# Patient Record
Sex: Male | Born: 1965 | Race: Black or African American | Hispanic: No | Marital: Married | Smoking: Former smoker
Health system: Southern US, Community
[De-identification: ages and names within clinical notes are randomized; demographics above are authoritative.]

## PROBLEM LIST (undated history)

## (undated) DIAGNOSIS — J45909 Unspecified asthma, uncomplicated: Secondary | ICD-10-CM

## (undated) HISTORY — PX: LYMPH NODE BIOPSY: SHX201

## (undated) HISTORY — PX: APPENDECTOMY: SHX54

---

## 2012-03-29 ENCOUNTER — Emergency Department (HOSPITAL_COMMUNITY)
Admission: EM | Admit: 2012-03-29 | Discharge: 2012-03-29 | Disposition: A | Discharge status: Home / Self Care | Payer: Self-pay | Attending: Emergency Medicine | Admitting: Emergency Medicine

## 2012-03-29 ENCOUNTER — Encounter (HOSPITAL_COMMUNITY): Payer: Self-pay

## 2012-03-29 DIAGNOSIS — H938X9 Other specified disorders of ear, unspecified ear: Secondary | ICD-10-CM

## 2012-03-29 DIAGNOSIS — IMO0002 Reserved for concepts with insufficient information to code with codable children: Secondary | ICD-10-CM | POA: Insufficient documentation

## 2012-03-29 DIAGNOSIS — R21 Rash and other nonspecific skin eruption: Secondary | ICD-10-CM | POA: Insufficient documentation

## 2012-03-29 DIAGNOSIS — T169XXA Foreign body in ear, unspecified ear, initial encounter: Secondary | ICD-10-CM | POA: Insufficient documentation

## 2012-03-29 DIAGNOSIS — J45909 Unspecified asthma, uncomplicated: Secondary | ICD-10-CM | POA: Insufficient documentation

## 2012-03-29 DIAGNOSIS — Z87891 Personal history of nicotine dependence: Secondary | ICD-10-CM | POA: Insufficient documentation

## 2012-03-29 HISTORY — DX: Unspecified asthma, uncomplicated: J45.909

## 2012-03-29 NOTE — ED Provider Notes (Signed)
History     CSN: [id number]  Arrival date & time 03/29/12  4098   First MD Initiated Contact with Patient 03/29/12 0800      Chief Complaint  Patient presents with  . Hearing Problem    (Consider location/radiation/quality/duration/timing/severity/associated sxs/prior treatment) HPI Pt states he has been trying to clean out his L ear with peroxide and Q-tips. Decreased hearing since yesterday out of L ear. No pain, fever or chills.  Has been working in the sun and complains of generalized rash. No itching, pain  Past Medical History  Diagnosis Date  . Asthma     Past Surgical History  Procedure Date  . Appendectomy     No family history on file.  History  Substance Use Topics  . Smoking status: Former Games developer  . Smokeless tobacco: Not on file  . Alcohol Use: No      Review of Systems  Constitutional: Negative for fever and chills.  HENT: Positive for hearing loss. Negative for ear pain, congestion, sore throat, rhinorrhea, neck pain, neck stiffness, sinus pressure and ear discharge.   Musculoskeletal: Negative for myalgias and arthralgias.  Skin: Positive for rash. Negative for wound.  Neurological: Negative for dizziness, weakness, numbness and headaches.    Allergies  Shellfish allergy  Home Medications   Current Outpatient Rx  Name Route Sig Dispense Refill  . ALBUTEROL SULFATE HFA 108 (90 BASE) MCG/ACT IN AERS Inhalation Inhale 2 puffs into the lungs every 4 (four) hours as needed. For asthma      BP 126/80  Pulse 63  Temp 97.7 F (36.5 C) (Oral)  Resp 20  SpO2 99%  Physical Exam  Nursing note and vitals reviewed. Constitutional: He is oriented to person, place, and time. He appears well-developed and well-nourished. No distress.  HENT:  Head: Normocephalic and atraumatic.  Mouth/Throat: Oropharynx is clear and moist.       L ear canal with debris adjacent to TM. No evidence of inflammation or TM perforation  Eyes: EOM are normal. Pupils are  equal, round, and reactive to light.  Neck: Normal range of motion. Neck supple.  Cardiovascular: Normal rate and regular rhythm.   Pulmonary/Chest: Effort normal and breath sounds normal. No respiratory distress. He has no wheezes. He has no rales.  Abdominal: Soft. Bowel sounds are normal. There is no tenderness. There is no rebound and no guarding.  Musculoskeletal: Normal range of motion. He exhibits no edema and no tenderness.  Neurological: He is alert and oriented to person, place, and time.       No hearing deficits noted  Skin: Skin is warm and dry. Rash (mild scatter papular rash on face and shoulders. No evidence of infection) noted. No erythema.  Psychiatric: He has a normal mood and affect. His behavior is normal.    ED Course  Procedures (including critical care time)  Labs Reviewed - No data to display No results found.   1. Clogged ear    L canal irrigated and debris flushed. Pt states he hears better. Advised to avoid excessive sun-exposure. Return for concerns   MDM          Loren Racer, MD 03/29/12 [phone]

## 2012-03-29 NOTE — ED Notes (Signed)
Lt. Hearing loss since yesterday, also would like his rash to be checked. Rash is on his shoulders, face.

## 2015-12-07 ENCOUNTER — Emergency Department (HOSPITAL_COMMUNITY): Payer: Self-pay

## 2015-12-07 ENCOUNTER — Telehealth: Payer: Self-pay | Admitting: *Deleted

## 2015-12-07 ENCOUNTER — Emergency Department (HOSPITAL_COMMUNITY)
Admission: EM | Admit: 2015-12-07 | Discharge: 2015-12-07 | Disposition: A | Discharge status: Home / Self Care | Payer: Self-pay | Attending: Emergency Medicine | Admitting: Emergency Medicine

## 2015-12-07 ENCOUNTER — Encounter (HOSPITAL_COMMUNITY): Payer: Self-pay | Admitting: *Deleted

## 2015-12-07 DIAGNOSIS — S62319A Displaced fracture of base of unspecified metacarpal bone, initial encounter for closed fracture: Secondary | ICD-10-CM

## 2015-12-07 DIAGNOSIS — S62316A Displaced fracture of base of fifth metacarpal bone, right hand, initial encounter for closed fracture: Secondary | ICD-10-CM | POA: Insufficient documentation

## 2015-12-07 DIAGNOSIS — S62314A Displaced fracture of base of fourth metacarpal bone, right hand, initial encounter for closed fracture: Secondary | ICD-10-CM | POA: Insufficient documentation

## 2015-12-07 DIAGNOSIS — J45909 Unspecified asthma, uncomplicated: Secondary | ICD-10-CM | POA: Insufficient documentation

## 2015-12-07 DIAGNOSIS — Y9389 Activity, other specified: Secondary | ICD-10-CM | POA: Insufficient documentation

## 2015-12-07 DIAGNOSIS — S63051A Subluxation of other carpometacarpal joint of right hand, initial encounter: Secondary | ICD-10-CM | POA: Insufficient documentation

## 2015-12-07 DIAGNOSIS — Z87891 Personal history of nicotine dependence: Secondary | ICD-10-CM | POA: Insufficient documentation

## 2015-12-07 DIAGNOSIS — Y998 Other external cause status: Secondary | ICD-10-CM | POA: Insufficient documentation

## 2015-12-07 DIAGNOSIS — Z79899 Other long term (current) drug therapy: Secondary | ICD-10-CM | POA: Insufficient documentation

## 2015-12-07 DIAGNOSIS — Y9289 Other specified places as the place of occurrence of the external cause: Secondary | ICD-10-CM | POA: Insufficient documentation

## 2015-12-07 MED ORDER — OXYCODONE-ACETAMINOPHEN 5-325 MG PO TABS: 1.0000 | ORAL_TABLET | Freq: Four times a day (QID) | ORAL | Status: DC | PRN

## 2015-12-07 MED ORDER — OXYCODONE-ACETAMINOPHEN 5-325 MG PO TABS
2.0000 | ORAL_TABLET | Freq: Once | ORAL | Status: AC
  Administered 2015-12-07: 2 via ORAL
  Filled 2015-12-07: qty 2

## 2015-12-07 NOTE — Telephone Encounter (Signed)
Spoke with pt who says he has been unable to sleep since his EDV this am and he is concerned that that the cast that was placed in the ED  is "too tight." Describes tingling in his fingers and thumb of his affected hand. CM instructed pt to elevate his hand above his head which he stated he has been doing. Encouraged Ice. He states he has been unable to sleep and hand continues to throb to the point where he is concerned. CM encouraged that he might visit the local Urgent Care and have the Ulnar Gutter Splint that was placed in the ED checked. CM explained that swelling is expected with a fracture of the Meta-carpel base of the right hand such as he sustained but if he is unable to feel any space between his skin and the splint due to swelling he might need to have it assessed by a medical professional.  Pt appreciative of assistance and reports he is going to have it checked today at his local Urgent Care. No further CM needs at this time.

## 2015-12-07 NOTE — ED Notes (Signed)
Pt is able to move fingers on the right hand. Sensation intact.
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
## 2015-12-07 NOTE — ED Notes (Signed)
Ortho aware of need for splint 

## 2015-12-07 NOTE — Discharge Instructions (Signed)
Keep your hand elevated as much as possible. Apply ice to limit swelling 3-4 times per day for 15-20 minutes each time. Wear a cast at all times. Follow up with Dr. Caralyn Guile as soon as you are able. Take Percocet as needed for pain control. Return to the ED if you have worsening numbness in your fingers and/or if your fingers turn pale/white or become cold.  Metacarpal Fracture A metacarpal fracture is a break (fracture) of a bone in the hand. Metacarpals are the bones that extend from your knuckles to your wrist. In each hand, you have five metacarpal bones that connect your fingers and your thumb to your wrist. Some hand fractures have bone pieces that are close together and stable (simple). These fractures may be treated with only a splint or cast. Hand fractures that have many pieces of broken bone (comminuted), unstable bone pieces (displaced), or a bone that breaks through the skin (compound) usually require surgery. CAUSES This injury may be caused by:  A fall.  A hard, direct hit to your hand.  An injury that squeezes your knuckle, stretches your finger out of place, or crushes your hand. RISK FACTORS This injury is more likely to occur if:  You play contact sports.  You have certain bone diseases. SYMPTOMS  Symptoms of this type of fracture develop soon after the injury. Symptoms may include:  Swelling.  Pain.  Stiffness.  Increased pain with movement.  Bruising.  Inability to move a finger.  A shortened finger.  A finger knuckle that looks sunken in.  Unusual appearance of the hand or finger (deformity). DIAGNOSIS  This injury may be diagnosed based on your signs and symptoms, especially if you had a recent hand injury. Your health care provider will perform a physical exam. He or she may also order X-rays to confirm the diagnosis.  TREATMENT  Treatment for this injury depends on the type of fracture you have and how severe it is. Possible treatments  include:  Non-reduction. This can be done if the bone does not need to be moved back into place. The fracture can be casted or splinted as it is.   Closed reduction. If your bone is stable and can be moved back into place, you may only need to wear a cast or splint or have buddy taping.  Closed reduction with internal fixation (CRIF). This is the most common treatment. You may have this procedure if your bone can be moved back into place but needs more support. Wires, pins, or screws may be inserted through your skin to stabilize the fracture.  Open reduction with internal fixation (ORIF). This may be needed if your fracture is severe and unstable. It involves surgery to move your bone back into the right position. Screws, wires, or plates are used to stabilize the fracture. After all procedures, you may need to wear a cast or a splint for several weeks. You will also need to have follow-up X-rays to make sure that the bone is healing well and staying in position. After you no longer need your cast or splint, you may need physical therapy. This will help you to regain full movement and strength in your hand.  HOME CARE INSTRUCTIONS  If You Have a Cast:  Do not stick anything inside the cast to scratch your skin. Doing that increases your risk of infection.  Check the skin around the cast every day. Report any concerns to your health care provider. You may put lotion on dry skin  around the edges of the cast. Do not apply lotion to the skin underneath the cast. If You Have a Splint:  Wear it as directed by your health care provider. Remove it only as directed by your health care provider.  Loosen the splint if your fingers become numb and tingle, or if they turn cold and blue. Bathing  Cover the cast or splint with a watertight plastic bag to protect it from water while you take a bath or a shower. Do not let the cast or splint get wet. Managing Pain, Stiffness, and Swelling  If directed,  apply ice to the injured area (if you have a splint, not a cast):  Put ice in a plastic bag.  Place a towel between your skin and the bag.  Leave the ice on for 20 minutes, 2-3 times a day.  Move your fingers often to avoid stiffness and to lessen swelling.  Raise the injured area above the level of your heart while you are sitting or lying down. Driving  Do not drive or operate heavy machinery while taking pain medicine.  Do not drive while wearing a cast or splint on a hand that you use for driving. Activity  Return to your normal activities as directed by your health care provider. Ask your health care provider what activities are safe for you. General Instructions  Do not put pressure on any part of the cast or splint until it is fully hardened. This may take several hours.  Keep the cast or splint clean and dry.  Do not use any tobacco products, including cigarettes, chewing tobacco, or electronic cigarettes. Tobacco can delay bone healing. If you need help quitting, ask your health care provider.  Take medicines only as directed by your health care provider.  Keep all follow-up visits as directed by your health care provider. This is important. SEEK MEDICAL CARE IF:   Your pain is getting worse.  You have redness, swelling, or pain in the injured area.   You have fluid, blood, or pus coming from under your cast or splint.   You notice a bad smell coming from under your cast or splint.   You have a fever.  SEEK IMMEDIATE MEDICAL CARE IF:   You develop a rash.   You have trouble breathing.   Your skin or nails on your injured hand turn blue or gray even after you loosen your splint.  Your injured hand feels cold or becomes numb even after you loosen your splint.   You develop severe pain under the cast or in your hand.   This information is not intended to replace advice given to you by your health care provider. Make sure you discuss any questions you  have with your health care provider.   Document Released: 09/06/2005 Document Revised: 05/28/2015 Document Reviewed: 06/26/2014 Elsevier Interactive Patient Education [employer].

## 2015-12-07 NOTE — ED Notes (Signed)
Pt given a small ice pack for rt hand
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
## 2016-09-08 ENCOUNTER — Encounter (HOSPITAL_COMMUNITY): Payer: Self-pay | Admitting: Vascular Surgery

## 2016-09-08 DIAGNOSIS — J45909 Unspecified asthma, uncomplicated: Secondary | ICD-10-CM | POA: Insufficient documentation

## 2016-09-08 DIAGNOSIS — Z202 Contact with and (suspected) exposure to infections with a predominantly sexual mode of transmission: Secondary | ICD-10-CM | POA: Insufficient documentation

## 2016-09-08 DIAGNOSIS — F17210 Nicotine dependence, cigarettes, uncomplicated: Secondary | ICD-10-CM | POA: Insufficient documentation

## 2016-09-08 NOTE — ED Triage Notes (Signed)
Pt reports to the ED for eval of possible STD exposure. Pt was told by his partner that she was treated for an STD and given Flagyl. Pt has had unprotected sexual intercourse with the partner. He denies any symptoms at this time.

## 2016-09-09 ENCOUNTER — Emergency Department (HOSPITAL_COMMUNITY)
Admission: EM | Admit: 2016-09-09 | Discharge: 2016-09-09 | Disposition: A | Discharge status: Home / Self Care | Payer: Self-pay | Attending: Emergency Medicine | Admitting: Emergency Medicine

## 2016-09-09 DIAGNOSIS — Z711 Person with feared health complaint in whom no diagnosis is made: Secondary | ICD-10-CM

## 2016-09-09 LAB — GC/CHLAMYDIA PROBE AMP (~~LOC~~) NOT AT ARMC
CHLAMYDIA, DNA PROBE: NEGATIVE
NEISSERIA GONORRHEA: NEGATIVE

## 2016-09-09 LAB — HIV ANTIBODY (ROUTINE TESTING W REFLEX): HIV Screen 4th Generation wRfx: NONREACTIVE

## 2016-09-09 LAB — RPR: RPR Ser Ql: NONREACTIVE

## 2016-09-09 MED ORDER — CEFTRIAXONE SODIUM 250 MG IJ SOLR
250.0000 mg | Freq: Once | INTRAMUSCULAR | Status: AC
  Administered 2016-09-09: 250 mg via INTRAMUSCULAR
  Filled 2016-09-09: qty 250

## 2016-09-09 MED ORDER — METRONIDAZOLE 500 MG PO TABS: 500.0000 mg | ORAL_TABLET | Freq: Two times a day (BID) | ORAL | 0 refills | Status: DC

## 2016-09-09 MED ORDER — AZITHROMYCIN 250 MG PO TABS
1000.0000 mg | ORAL_TABLET | Freq: Once | ORAL | Status: AC
  Administered 2016-09-09: 1000 mg via ORAL
  Filled 2016-09-09: qty 4

## 2016-09-09 NOTE — ED Notes (Signed)
See EDP assessment 

## 2016-09-09 NOTE — ED Provider Notes (Signed)
[facility] DEPT Provider Note   CSN: [id number] Arrival date & time: 09/08/16  2332     History   Chief Complaint Chief Complaint  Patient presents with  . Exposure to STD    HPI Mark Abbott is a 50 y.o. male.  Mark Abbott is a 50 y.o. Male who presents to the emergency department with concern for STD. Patient reports his most recent sexual partner was prescribed Flagyl. He is unsure what she was diagnosed with. Suspect Trichomonas. He reports he was sexually active with her without production. He denies any symptoms of an STD currently. He denies any penile discharge. No rashes. He does want to be tested for gonorrhea, chlamydia, HIV and syphilis. He tells me he would like treatment for gonorrhea and chlamydia here today as well. Patient denies fevers, abdominal pain, nausea, vomiting, penile discharge, testicular pain, penile pain or rashes.   The history is provided by the patient. No language interpreter was used.  Exposure to STD  Pertinent negatives include no abdominal pain.    Past Medical History:  Diagnosis Date  . Asthma     There are no active problems to display for this patient.   Past Surgical History:  Procedure Laterality Date  . APPENDECTOMY    . LYMPH NODE BIOPSY         Home Medications    Prior to Admission medications   Medication Sig Start Date End Date Taking? Authorizing Provider  albuterol (PROVENTIL HFA;VENTOLIN HFA) 108 (90 BASE) MCG/ACT inhaler Inhale 2 puffs into the lungs every 4 (four) hours as needed. For asthma    Historical Provider, MD  metroNIDAZOLE (FLAGYL) 500 MG tablet Take 1 tablet (500 mg total) by mouth 2 (two) times daily. 09/09/16   Waynetta Pean, PA-C  oxyCODONE-acetaminophen (PERCOCET/ROXICET) 5-325 MG tablet Take 1-2 tablets by mouth every 6 (six) hours as needed for moderate pain or severe pain. 12/07/15   Antonietta Breach, PA-C    Family History No family history on file.  Social History Social History    Substance Use Topics  . Smoking status: Current Every Day Smoker    Packs/day: 0.25    Types: Cigarettes  . Smokeless tobacco: Never Used  . Alcohol use No     Allergies   Shellfish allergy   Review of Systems Review of Systems  Constitutional: Negative for fever.  HENT: Negative for mouth sores.   Gastrointestinal: Negative for abdominal pain, nausea and vomiting.  Genitourinary: Negative for difficulty urinating, discharge, frequency, penile pain, penile swelling, testicular pain and urgency.  Skin: Negative for rash and wound.     Physical Exam Updated Vital Signs BP 135/76 (BP Location: Right Arm)   Pulse 92   Temp 98.4 F (36.9 C) (Oral)   Resp 16   SpO2 97%   Physical Exam  Constitutional: He appears well-developed and well-nourished. No distress.  HENT:  Head: Normocephalic and atraumatic.  Eyes: Right eye exhibits no discharge. Left eye exhibits no discharge.  Pulmonary/Chest: Effort normal. No respiratory distress.  Abdominal: Soft. There is no tenderness.  Genitourinary: Penis normal. No penile tenderness.  Genitourinary Comments: Male RN present chaperone. No penile discharge or tenderness to palpation. Patient is circumcised. No GU rashes noted. No testicular tenderness to palpation.  Neurological: He is alert. Coordination normal.  Skin: Skin is warm and dry. Capillary refill takes less than 2 seconds. No rash noted. He is not diaphoretic. No erythema. No pallor.  Psychiatric: He has a normal mood and affect. His behavior  is normal.  Nursing note and vitals reviewed.    ED Treatments / Results  Labs (all labs ordered are listed, but only abnormal results are displayed) Labs Reviewed  RPR  HIV ANTIBODY (ROUTINE TESTING)  GC/CHLAMYDIA PROBE AMP ([place]) NOT AT [facility]    EKG  EKG Interpretation None       Radiology No results found.  Procedures Procedures (including critical care time)  Medications Ordered in ED Medications   cefTRIAXone (ROCEPHIN) injection 250 mg (not administered)  azithromycin (ZITHROMAX) tablet 1,000 mg (not administered)     Initial Impression / Assessment and Plan / ED Course  I have reviewed the triage vital signs and the nursing notes.  Pertinent labs & imaging results that were available during my care of the patient were reviewed by me and considered in my medical decision making (see chart for details).  Clinical Course    This is a 50 y.o. Male who presents to the emergency department with concern for STD. Patient reports his most recent sexual partner was prescribed Flagyl. He is unsure what she was diagnosed with. Suspect Trichomonas. He reports he was sexually active with her without production. He denies any symptoms of an STD currently. He denies any penile discharge. No rashes. He does want to be tested for gonorrhea, chlamydia, HIV and syphilis. He tells me he would like treatment for gonorrhea and chlamydia here today as well. On exam the patient is afebrile nontoxic appearing. No GU rashes noted. No penile discharge noted. Abdomen is soft and nontender to palpation. Will go ahead and treat for gonorrhea and chlamydia with Rocephin and azithromycin. Testing pending for HIV, syphilis, gonorrhea and chlamydia. I discussed safe sex practices. I advised that his testing is pending. I encouraged him to follow-up on these test results. I encouraged him to advise all sexual partners. We'll also treat with Flagyl for possible Trichomonas. I advised the patient to follow-up with their primary care provider this week. I advised the patient to return to the emergency department with new or worsening symptoms or new concerns. The patient verbalized understanding and agreement with plan.    Final Clinical Impressions(s) / ED Diagnoses   Final diagnoses:  Concern about STD in male without diagnosis    New Prescriptions New Prescriptions   METRONIDAZOLE (FLAGYL) 500 MG TABLET    Take 1  tablet (500 mg total) by mouth 2 (two) times daily.     Waynetta Pean, PA-C 09/09/16 [id number]    Everlene Balls, MD 09/09/16 [phone]

## 2018-08-19 ENCOUNTER — Emergency Department (HOSPITAL_COMMUNITY)
Admission: EM | Admit: 2018-08-19 | Discharge: 2018-08-19 | Disposition: A | Discharge status: Home / Self Care | Payer: No Typology Code available for payment source | Attending: Emergency Medicine | Admitting: Emergency Medicine

## 2018-08-19 ENCOUNTER — Encounter (HOSPITAL_COMMUNITY): Payer: Self-pay | Admitting: Emergency Medicine

## 2018-08-19 DIAGNOSIS — M25512 Pain in left shoulder: Secondary | ICD-10-CM | POA: Diagnosis present

## 2018-08-19 DIAGNOSIS — F17210 Nicotine dependence, cigarettes, uncomplicated: Secondary | ICD-10-CM | POA: Insufficient documentation

## 2018-08-19 DIAGNOSIS — Z79899 Other long term (current) drug therapy: Secondary | ICD-10-CM | POA: Diagnosis not present

## 2018-08-19 DIAGNOSIS — J45909 Unspecified asthma, uncomplicated: Secondary | ICD-10-CM | POA: Insufficient documentation

## 2018-08-19 MED ORDER — LIDOCAINE 5 % EX PTCH: 1.0000 | MEDICATED_PATCH | CUTANEOUS | 0 refills | Status: DC

## 2018-08-19 MED ORDER — IBUPROFEN 600 MG PO TABS: 600.0000 mg | ORAL_TABLET | Freq: Four times a day (QID) | ORAL | 0 refills | Status: DC | PRN

## 2018-08-19 MED ORDER — METHOCARBAMOL 500 MG PO TABS: 500.0000 mg | ORAL_TABLET | Freq: Two times a day (BID) | ORAL | 0 refills | Status: DC
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
## 2018-08-19 NOTE — Discharge Instructions (Addendum)
Expect your soreness to increase over the next 2-3 days. Take it easy, but do not lay around too much as this may make any stiffness worse.  Antiinflammatory medications: Take 600 mg of ibuprofen every 6 hours or 440 mg (over the counter dose) to 500 mg (prescription dose) of naproxen every 12 hours for the next 3 days. After this time, these medications may be used as needed for pain. Take these medications with food to avoid upset stomach. Choose only one of these medications, do not take them together. Acetaminophen (generic for Tylenol): Should you continue to have additional pain while taking the ibuprofen or naproxen, you may add in acetaminophen as needed. Your daily total maximum amount of acetaminophen from all sources should be limited to 4000mg /day for persons without liver problems, or 2000mg /day for those with liver problems. Muscle relaxer: Robaxin is a muscle relaxer and may help loosen stiff muscles. Do not take the Robaxin while driving or performing other dangerous activities.  Lidocaine patches: These are available via either prescription or over-the-counter. The over-the-counter option may be more economical one and are likely just as effective. There are multiple over-the-counter brands, such as Salonpas. Exercises: Be sure to perform the attached exercises starting with three times a week and working up to performing them daily. This is an essential part of preventing long term problems.  Support: May use the sling, as needed for comfort.  Be sure to take the arm out of the sling multiple times throughout the day and perform range of motion exercises. Follow up: If symptoms are improving, follow up with a primary care provider for any future management of these complaints. Be sure to follow up within 7-10 days.  If symptoms do not seem to be improving, follow-up with the orthopedic specialist. Return: Return to the ED should symptoms worsen.  For prescription assistance, may try using  prescription discount sites or apps, such as goodrx.com
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
## 2018-08-25 ENCOUNTER — Encounter (INDEPENDENT_AMBULATORY_CARE_PROVIDER_SITE_OTHER): Payer: Self-pay | Admitting: Family Medicine

## 2018-08-25 ENCOUNTER — Ambulatory Visit (INDEPENDENT_AMBULATORY_CARE_PROVIDER_SITE_OTHER): Payer: Self-pay

## 2018-08-25 ENCOUNTER — Ambulatory Visit (INDEPENDENT_AMBULATORY_CARE_PROVIDER_SITE_OTHER): Payer: No Typology Code available for payment source | Admitting: Family Medicine

## 2018-08-25 DIAGNOSIS — M25512 Pain in left shoulder: Secondary | ICD-10-CM

## 2018-08-25 MED ORDER — ETODOLAC 400 MG PO TABS
400.0000 mg | ORAL_TABLET | Freq: Two times a day (BID) | ORAL | 3 refills | Status: DC | PRN
Start: 2018-08-25 — End: 2018-10-20
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
## 2018-08-30 ENCOUNTER — Telehealth (INDEPENDENT_AMBULATORY_CARE_PROVIDER_SITE_OTHER): Payer: Self-pay | Admitting: Family Medicine

## 2018-08-30 ENCOUNTER — Other Ambulatory Visit (INDEPENDENT_AMBULATORY_CARE_PROVIDER_SITE_OTHER): Payer: Self-pay | Admitting: Family Medicine

## 2018-08-30 DIAGNOSIS — M25512 Pain in left shoulder: Secondary | ICD-10-CM

## 2018-08-30 NOTE — Telephone Encounter (Signed)
Yes, new orders placed.

## 2018-08-30 NOTE — Telephone Encounter (Signed)
Patient lmom stating that [facility] do not take his insurance and request that we find therapy within his network. Patient's call back # [phone]

## 2018-08-30 NOTE — Telephone Encounter (Signed)
Should we send to [facility] therapy?

## 2018-08-30 NOTE — Telephone Encounter (Signed)
Advised patient of the location and telephone number to [facility] - he will call and set up an appointment, as the referral has been placed in the system.

## 2018-09-05 ENCOUNTER — Ambulatory Visit: Payer: Self-pay | Attending: Family Medicine | Admitting: Physical Therapy

## 2018-09-05 ENCOUNTER — Other Ambulatory Visit: Payer: Self-pay

## 2018-09-05 ENCOUNTER — Encounter: Payer: Self-pay | Admitting: Physical Therapy

## 2018-09-05 DIAGNOSIS — M542 Cervicalgia: Secondary | ICD-10-CM | POA: Insufficient documentation

## 2018-09-05 DIAGNOSIS — M6281 Muscle weakness (generalized): Secondary | ICD-10-CM | POA: Insufficient documentation

## 2018-09-05 DIAGNOSIS — M25512 Pain in left shoulder: Secondary | ICD-10-CM | POA: Insufficient documentation
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
## 2018-09-18 ENCOUNTER — Ambulatory Visit: Payer: Self-pay | Admitting: Physical Therapy

## 2018-09-22 ENCOUNTER — Ambulatory Visit: Payer: PRIVATE HEALTH INSURANCE | Attending: Family Medicine | Admitting: Physical Therapy

## 2018-09-22 DIAGNOSIS — M542 Cervicalgia: Secondary | ICD-10-CM | POA: Insufficient documentation

## 2018-09-22 DIAGNOSIS — M6281 Muscle weakness (generalized): Secondary | ICD-10-CM | POA: Diagnosis present

## 2018-09-22 DIAGNOSIS — M25512 Pain in left shoulder: Secondary | ICD-10-CM | POA: Insufficient documentation

## 2018-09-22 NOTE — Therapy (Signed)
[place], [place], [address] Phone: [phone]   Fax:  [phone]  Physical Therapy Treatment  Patient Details  Name: Mark Abbott MRN: [id number] Date of Birth: [birth year] Referring Provider (PT): Eunice Blase, MD   Encounter Date: 09/22/2018  PT End of Session - 09/22/18 0925    Visit Number  2    Number of Visits  13    Date for PT Re-Evaluation  10/20/18    Authorization Type  first health    PT Start Time  [phone]   pt late   PT Stop Time  0934    PT Time Calculation (min)  39 min    Activity Tolerance  Patient tolerated treatment well;Patient limited by pain    Behavior During Therapy  [facility] for tasks assessed/performed       Past Medical History:  Diagnosis Date  . Asthma     Past Surgical History:  Procedure Laterality Date  . APPENDECTOMY    . LYMPH NODE BIOPSY      There were no vitals filed for this visit.  Subjective Assessment - 09/22/18 0859    Subjective  Pt relays all his joints are stiff and painful, he is having a lot of pain in his neck and Lt shoulder    Currently in Pain?  Yes    Pain Score  7     Pain Location  Shoulder    Pain Orientation  Left    Pain Descriptors / Indicators  Aching    Pain Type  --   acute on chronic                      [facility] Adult PT Treatment/Exercise - 09/22/18 0001      Exercises   Exercises  Shoulder;Neck      Neck Exercises: Supine   Neck Retraction  15 reps   1 pillow     Shoulder Exercises: Supine   External Rotation  AAROM;Both;15 reps   with stick   Flexion  AAROM;Both;15 reps   with stick   Other Supine Exercises  chest press with stick X 15      Shoulder Exercises: Seated   Retraction  Both;20 reps      Shoulder Exercises: Pulleys   Flexion  2 minutes    ABduction  2 minutes      Shoulder Exercises: Stretch   Cross Chest Stretch  2 reps;30 seconds    Other Shoulder Stretches  UT stretch 30 sec X 2  bilat      Modalities   Modalities  Electrical Stimulation;Moist Heat      Moist Heat Therapy   Number Minutes Moist Heat  15 Minutes    Moist Heat Location  Cervical      Electrical Stimulation   Electrical Stimulation Location  neck and UT    Electrical Stimulation Action  IFC    Electrical Stimulation Parameters  tolerance, pt supine with legs elevated    Electrical Stimulation Goals  Pain;Tone             PT Education - 09/22/18 [id number]    Education Details  TENS    Person(s) Educated  Patient    Methods  Explanation;Handout    Comprehension  Verbalized understanding          PT Long Term Goals - 09/05/18 1113      PT LONG TERM GOAL #1   Title  Pt will demo full  AROM in neck and shoulder.     Baseline  see flowsheet    Time  6    Period  Weeks    Status  New    Target Date  10/20/18      PT LONG TERM GOAL #2   Title  Gross GHJ strength to 5/5    Baseline  gross 3-/5  unable to lift full range against gravity    Time  6    Period  Weeks    Status  New    Target Date  10/20/18      PT LONG TERM GOAL #3   Title  pt will be able to lift and move daily objects around his house, driving and in the community    Baseline  unable to use Lt arm at this time    Time  6    Period  Weeks    Status  New    Target Date  10/20/18      PT LONG TERM GOAL #4   Title  FOTO to 29% limitation    Baseline  55% limited at eval    Time  6    Period  Weeks    Status  New    Target Date  10/20/18            Plan - 09/22/18 5456    Clinical Impression Statement  Pt is very stiff and guarded with exercises and stretches and was constantly reminded to stay in gentle pain free ROM. He was trialed with MHP and TENS at end of session to decrease pain and tone with positive return. He was then provided print out for home TENS recommendation. PT will continue with gentle progression as albe.     Rehab Potential  Good    PT Frequency  2x / week    PT Duration  6 weeks     PT Treatment/Interventions  ADLs/Self Care Home Management;Cryotherapy;Electrical Stimulation;Iontophoresis 4mg /ml Dexamethasone;Moist Heat;Therapeutic activities;Ultrasound;Traction;Therapeutic exercise;Neuromuscular re-education;Patient/family education;Dry needling;Passive range of motion;Manual techniques;Taping    PT Next Visit Plan  DN upper trap & pectoralis, periscap activation    PT Home Exercise Plan  supine chin tuck, scap retraction & GHJ flx wand; upper trap & levator stretch, STM with tennis ball    Consulted and Agree with Plan of Care  Patient       Patient will benefit from skilled therapeutic intervention in order to improve the following deficits and impairments:  Decreased activity tolerance, Decreased strength, Impaired UE functional use, Pain, Increased muscle spasms, Improper body mechanics, Decreased range of motion, Impaired flexibility, Postural dysfunction  Visit Diagnosis: Acute pain of left shoulder  Muscle weakness (generalized)  Cervicalgia     Problem List There are no active problems to display for this patient.   Mark Abbott 09/22/2018, 9:29 AM  [facility] [address] [place], [place], [address] Phone: [phone]   Fax:  [phone]  Name: Mark Abbott MRN: [id number] Date of Birth: [birth year]

## 2018-09-22 NOTE — Patient Instructions (Signed)

## 2018-09-25 ENCOUNTER — Ambulatory Visit: Payer: PRIVATE HEALTH INSURANCE | Admitting: Physical Therapy

## 2018-09-25 ENCOUNTER — Telehealth: Payer: Self-pay | Admitting: Physical Therapy

## 2018-09-25 NOTE — Telephone Encounter (Signed)
Called patient about missed visit today. He was at another MD appointment.  Date and time of next visit given.  Patient reminded to call if he is unable to attend.  He was asked to check policy for missed visits.  He was told he could reschedule the appointment by calling the office. Melvenia Needles PTA

## 2018-09-29 ENCOUNTER — Ambulatory Visit: Payer: PRIVATE HEALTH INSURANCE | Admitting: Physical Therapy

## 2018-09-29 ENCOUNTER — Encounter: Payer: Self-pay | Admitting: Physical Therapy

## 2018-09-29 DIAGNOSIS — M25512 Pain in left shoulder: Secondary | ICD-10-CM | POA: Diagnosis not present

## 2018-09-29 DIAGNOSIS — M542 Cervicalgia: Secondary | ICD-10-CM

## 2018-09-29 DIAGNOSIS — M6281 Muscle weakness (generalized): Secondary | ICD-10-CM

## 2018-09-29 NOTE — Therapy (Signed)
[name], [place], [address] Phone: [phone]   Fax:  [phone]  Physical Therapy Treatment  Patient Details  Name: Mark Abbott MRN: [id number] Date of Birth: [birth year] Referring Provider (PT): Eunice Blase, MD   Encounter Date: 09/29/2018  PT End of Session - 09/29/18 0853    Visit Number  3    Number of Visits  13    Date for PT Re-Evaluation  10/20/18    Authorization Type  first health    PT Start Time  0845    PT Stop Time  0930    PT Time Calculation (min)  45 min       Past Medical History:  Diagnosis Date  . Asthma     Past Surgical History:  Procedure Laterality Date  . APPENDECTOMY    . LYMPH NODE BIOPSY      There were no vitals filed for this visit.  Subjective Assessment - 09/29/18 0851    Subjective  My neck pain stays there, never leaves. I cannot turn my head without turning my body. I got the TENS unit but I do not get any lasting relief.     Currently in Pain?  Yes    Pain Score  8     Pain Location  Neck    Pain Orientation  Right    Pain Descriptors / Indicators  --   stiffness        [facility] PT Assessment - 09/29/18 0001      AROM   Left Shoulder Flexion  --   supine to 135 with dowel                  [facility] Adult PT Treatment/Exercise - 09/29/18 0001      Neck Exercises: Standing   Neck Retraction  10 reps      Neck Exercises: Seated   Other Seated Exercise  AROM cervical rotation and lateral flexion      Neck Exercises: Supine   Neck Retraction  15 reps   1 pillow     Shoulder Exercises: Supine   External Rotation  AAROM;Both;15 reps   with stick   Flexion  AAROM;Both;15 reps   with stick   Other Supine Exercises  chest press with stick X 15      Shoulder Exercises: Seated   Retraction  Both;10 reps   standing      Shoulder Exercises: Pulleys   Flexion  2 minutes    ABduction  2 minutes      Manual Therapy   Manual  Therapy  Soft tissue mobilization    Manual therapy comments  gentle PROM cervical rotation     Soft tissue mobilization  Right cervical paraspinals                  PT Long Term Goals - 09/05/18 1113      PT LONG TERM GOAL #1   Title  Pt will demo full AROM in neck and shoulder.     Baseline  see flowsheet    Time  6    Period  Weeks    Status  New    Target Date  10/20/18      PT LONG TERM GOAL #2   Title  Gross GHJ strength to 5/5    Baseline  gross 3-/5  unable to lift full range against gravity    Time  6    Period  Weeks  Status  New    Target Date  10/20/18      PT LONG TERM GOAL #3   Title  pt will be able to lift and move daily objects around his house, driving and in the community    Baseline  unable to use Lt arm at this time    Time  6    Period  Weeks    Status  New    Target Date  10/20/18      PT LONG TERM GOAL #4   Title  FOTO to 29% limitation    Baseline  55% limited at eval    Time  6    Period  Weeks    Status  New    Target Date  10/20/18            Plan - 09/29/18 0935    Clinical Impression Statement  Pt arrives reporting right neck pain and is guarded with cervical motions. He works and avoids movement due to pain. Is not currently consistent with HEP due to fear of harm. Time spent on education about how movement can improve his pain. He responded well and was able to tolerate gentle soft tissue work to right cervical paraspinals. Banding in muscles decreased after soft tissue. He now has a TENS at home. At end of session he felt much better and was moving his neck more freely.     PT Next Visit Plan  DN upper trap & pectoralis, periscap activation, continue to decrease fear of movement, gentle soft tissue work    PT Home Exercise Plan  supine chin tuck, scap retraction & GHJ flx wand; upper trap & levator stretch, STM with tennis ball    Consulted and Agree with Plan of Care  Patient       Patient will benefit from skilled  therapeutic intervention in order to improve the following deficits and impairments:  Decreased activity tolerance, Decreased strength, Impaired UE functional use, Pain, Increased muscle spasms, Improper body mechanics, Decreased range of motion, Impaired flexibility, Postural dysfunction  Visit Diagnosis: Acute pain of left shoulder  Muscle weakness (generalized)  Cervicalgia     Problem List There are no active problems to display for this patient.   Dorene Ar, [place] 09/29/2018, 9:52 AM  [facility] [address] [place], [place], [address] Phone: [phone]   Fax:  [phone]  Name: Mark Abbott MRN: [id number] Date of Birth: 08-24-1966

## 2018-10-02 ENCOUNTER — Ambulatory Visit: Payer: PRIVATE HEALTH INSURANCE | Admitting: Physical Therapy

## 2018-10-03 ENCOUNTER — Encounter: Payer: Self-pay | Admitting: Physical Therapy

## 2018-10-03 ENCOUNTER — Ambulatory Visit: Payer: PRIVATE HEALTH INSURANCE | Admitting: Physical Therapy

## 2018-10-03 DIAGNOSIS — M542 Cervicalgia: Secondary | ICD-10-CM

## 2018-10-03 DIAGNOSIS — M25512 Pain in left shoulder: Secondary | ICD-10-CM

## 2018-10-03 DIAGNOSIS — M6281 Muscle weakness (generalized): Secondary | ICD-10-CM

## 2018-10-03 NOTE — Patient Instructions (Signed)
Side Pull: Double Arm   On back, knees bent, feet flat. Arms perpendicular to body, shoulder level, elbows straight but relaxed. Pull arms out to sides, elbows straight. Resistance band comes across collarbones, hands toward floor. Hold momentarily. Slowly return to starting position. Repeat _10-20__ times. Band color _R____    Shoulder Rotation: Double Arm   On back, knees bent, feet flat, elbows tucked at sides, bent 90, hands palms up. Pull hands apart and down toward floor, keeping elbows near sides. Hold momentarily. Slowly return to starting position. Repeat ___10-20  times. Band color ___R___

## 2018-10-03 NOTE — Therapy (Signed)
[place], [place], [address] Phone: [phone]   Fax:  [phone]  Physical Therapy Treatment  Patient Details  Name: Mark Abbott MRN: [id number] Date of Birth: 1966-08-22 Referring Provider (PT): Eunice Blase, MD   Encounter Date: 10/03/2018  PT End of Session - 10/03/18 0851    Visit Number  4    Number of Visits  13    Date for PT Re-Evaluation  10/20/18    Authorization Type  first health    PT Start Time  0802    PT Stop Time  0850    PT Time Calculation (min)  48 min       Past Medical History:  Diagnosis Date  . Asthma     Past Surgical History:  Procedure Laterality Date  . APPENDECTOMY    . LYMPH NODE BIOPSY      There were no vitals filed for this visit.  Subjective Assessment - 10/03/18 0806    Subjective  Pain went down a little bit. I still have a crick in my neck that is not going anywhere.     Currently in Pain?  Yes    Pain Score  7     Pain Location  Neck    Pain Orientation  Right;Left;Posterior    Pain Descriptors / Indicators  --   crick   Aggravating Factors   not sure, constant     Pain Relieving Factors  OTC meds and muscles relaxer                       [facility] Adult PT Treatment/Exercise - 10/03/18 0001      Neck Exercises: Supine   Neck Retraction  15 reps   1 pillow   Other Supine Exercise  --      Shoulder Exercises: Supine   Horizontal ABduction  10 reps    Theraband Level (Shoulder Horizontal ABduction)  Level 2 (Red)    External Rotation  Both;10 reps;Theraband    Theraband Level (Shoulder External Rotation)  Level 2 (Red)    Other Supine Exercises  supine scap retract x 10       Shoulder Exercises: Standing   Horizontal ABduction  10 reps    Theraband Level (Shoulder Horizontal ABduction)  Level 2 (Red)    External Rotation  10 reps    Theraband Level (Shoulder External Rotation)  Level 2 (Red)    Row  15 reps    Theraband  Level (Shoulder Row)  Level 2 (Red)      Shoulder Exercises: Pulleys   Flexion  2 minutes      Shoulder Exercises: Stretch   Corner Stretch  2 reps;20 seconds      Manual Therapy   Manual therapy comments  gentle PROM cervical rotation , cervical distraction 10 sec x 3 , lateral flexion PROM       Neck Exercises: Stretches   Upper Trapezius Stretch  2 reps;20 seconds    Levator Stretch  2 reps;20 seconds             PT Education - 10/03/18 0850    Education Details  HEP    Person(s) Educated  Patient    Methods  Explanation;Handout    Comprehension  Verbalized understanding          PT Long Term Goals - 09/05/18 1113      PT LONG TERM GOAL #1   Title  Pt will  demo full AROM in neck and shoulder.     Baseline  see flowsheet    Time  6    Period  Weeks    Status  New    Target Date  10/20/18      PT LONG TERM GOAL #2   Title  Gross GHJ strength to 5/5    Baseline  gross 3-/5  unable to lift full range against gravity    Time  6    Period  Weeks    Status  New    Target Date  10/20/18      PT LONG TERM GOAL #3   Title  pt will be able to lift and move daily objects around his house, driving and in the community    Baseline  unable to use Lt arm at this time    Time  6    Period  Weeks    Status  New    Target Date  10/20/18      PT LONG TERM GOAL #4   Title  FOTO to 29% limitation    Baseline  55% limited at eval    Time  6    Period  Weeks    Status  New    Target Date  10/20/18            Plan - 10/03/18 9798    Clinical Impression Statement  Pt arrived reporting decreased pain however still has a "crick" in his neck. He reports performing HEP more consistantly and his Domingo Mend' is performing passive neck rotations for him. Today we progressed to theraband scap stab in standing and supine which he tolerated very well, updated HEP. Manua cervcical distraction,  soft tissue work and PROM of cervical rotation and side bend was performed by SPTA.  Afterward, he reports pain decreased from 7/10 to 5/10.     PT Next Visit Plan  DN upper trap & pectoralis, periscap activation, continue to decrease fear of movement, gentle soft tissue work, review HEP bands and progress    PT Home Exercise Plan  supine chin tuck, scap retraction & GHJ flx wand; upper trap & levator stretch, STM with tennis ball    Consulted and Agree with Plan of Care  Patient       Patient will benefit from skilled therapeutic intervention in order to improve the following deficits and impairments:  Decreased activity tolerance, Decreased strength, Impaired UE functional use, Pain, Increased muscle spasms, Improper body mechanics, Decreased range of motion, Impaired flexibility, Postural dysfunction  Visit Diagnosis: Acute pain of left shoulder  Muscle weakness (generalized)  Cervicalgia     Problem List There are no active problems to display for this patient.   Dorene Ar, [place] 10/03/2018, 9:06 AM  [facility] [address] [place], [place], [address] Phone: [phone]   Fax:  [phone]  Name: Brennyn Haisley MRN: [id number] Date of Birth: 1966/06/18

## 2018-10-06 ENCOUNTER — Ambulatory Visit: Payer: PRIVATE HEALTH INSURANCE | Admitting: Physical Therapy

## 2018-10-06 ENCOUNTER — Encounter: Payer: Self-pay | Admitting: Physical Therapy

## 2018-10-06 DIAGNOSIS — M6281 Muscle weakness (generalized): Secondary | ICD-10-CM

## 2018-10-06 DIAGNOSIS — M25512 Pain in left shoulder: Secondary | ICD-10-CM

## 2018-10-06 DIAGNOSIS — M542 Cervicalgia: Secondary | ICD-10-CM

## 2018-10-06 NOTE — Therapy (Signed)
[place], [place], [address] Phone: [phone]   Fax:  [phone]  Physical Therapy Treatment  Patient Details  Name: Mark Abbott MRN: [id number] Date of Birth: 02/26/1966 Referring Provider (PT): Eunice Blase, MD   Encounter Date: 10/06/2018  PT End of Session - 10/06/18 0935    Visit Number  5    Number of Visits  13    Date for PT Re-Evaluation  10/20/18    Authorization Type  first health    PT Start Time  0850    PT Stop Time  0933    PT Time Calculation (min)  43 min    Activity Tolerance  Patient tolerated treatment well;Patient limited by pain    Behavior During Therapy  [facility] for tasks assessed/performed       Past Medical History:  Diagnosis Date  . Asthma     Past Surgical History:  Procedure Laterality Date  . APPENDECTOMY    . LYMPH NODE BIOPSY      There were no vitals filed for this visit.  Subjective Assessment - 10/06/18 0852    Subjective  "I've been practicing posture at work and I'm still having pain in my neck."    Currently in Pain?  Yes    Pain Score  5     Pain Location  Neck    Pain Orientation  Right;Left;Posterior    Pain Descriptors / Indicators  Other (Comment)   Stiffness   Pain Type  Other (Comment)   Acute on chronic   Aggravating Factors   constant    Pain Relieving Factors  Muscle relaxers                       [facility] Adult PT Treatment/Exercise - 10/06/18 0001      Shoulder Exercises: Supine   Horizontal ABduction  10 reps    Theraband Level (Shoulder Horizontal ABduction)  Level 2 (Red)    External Rotation  Both;10 reps;Theraband    Theraband Level (Shoulder External Rotation)  Level 2 (Red)    Other Supine Exercises  supine scap retract x 10       Shoulder Exercises: Seated   Diagonals  10 reps;Both    Theraband Level (Shoulder Diagonals)  --   Attempted with TB but too difficult     Shoulder Exercises: Standing   External Rotation  20 reps;Theraband    Theraband Level (Shoulder External Rotation)  Level 2 (Red)   Attempted green, but too difficult     Shoulder Exercises: Stretch   Star Gazer Stretch  2 reps;30 seconds      Manual Therapy   Manual Therapy  Soft tissue mobilization    Manual therapy comments  Bilat Neck to decrease spasms                  PT Long Term Goals - 09/05/18 1113      PT LONG TERM GOAL #1   Title  Pt will demo full AROM in neck and shoulder.     Baseline  see flowsheet    Time  6    Period  Weeks    Status  New    Target Date  10/20/18      PT LONG TERM GOAL #2   Title  Gross GHJ strength to 5/5    Baseline  gross 3-/5  unable to lift full range against gravity    Time  6  Period  Weeks    Status  New    Target Date  10/20/18      PT LONG TERM GOAL #3   Title  pt will be able to lift and move daily objects around his house, driving and in the community    Baseline  unable to use Lt arm at this time    Time  6    Period  Weeks    Status  New    Target Date  10/20/18      PT LONG TERM GOAL #4   Title  FOTO to 29% limitation    Baseline  55% limited at eval    Time  6    Period  Weeks    Status  New    Target Date  10/20/18            Plan - 10/06/18 4742    Clinical Impression Statement  Pt arrived reporting he has been working on his posture at work and that his pain is more minimal that it usually is. He states he is performing more of HEP on a consistent basis and can feel the improvements. Provided pt education about trigger point release and thera cane. Added star gazer stretch to relax pectoral muscles and provided STM to relax upper trap spasms. Pt reports feeling better after tx and is progressing towards goals.     PT Frequency  2x / week    PT Duration  6 weeks    PT Treatment/Interventions  ADLs/Self Care Home Management;Cryotherapy;Electrical Stimulation;Iontophoresis 4mg /ml Dexamethasone;Moist Heat;Therapeutic  activities;Ultrasound;Traction;Therapeutic exercise;Neuromuscular re-education;Patient/family education;Dry needling;Passive range of motion;Manual techniques;Taping    PT Next Visit Plan  DN upper trap & pectoralis, periscap activation, continue to decrease fear of movement, gentle soft tissue work, review HEP bands and progress, add functional lifting exercises    PT Home Exercise Plan  supine chin tuck, scap retraction & GHJ flx wand; upper trap & levator stretch, STM with tennis ball    Consulted and Agree with Plan of Care  Patient      During this treatment session, the therapist was present, participating in and directing the treatment.  Patient will benefit from skilled therapeutic intervention in order to improve the following deficits and impairments:  Decreased activity tolerance, Decreased strength, Impaired UE functional use, Pain, Increased muscle spasms, Improper body mechanics, Decreased range of motion, Impaired flexibility, Postural dysfunction  Visit Diagnosis: Acute pain of left shoulder  Muscle weakness (generalized)  Cervicalgia     Problem List There are no active problems to display for this patient.   Fuller Mandril, SPTA 10/06/2018, 9:41 AM   Hessie Diener, PTA 10/06/18 10:44 AM Phone: [phone] Fax: [facility] [address] [place], [place], [address] Phone: [phone]   Fax:  [phone]  Name: Mark Abbott MRN: [id number] Date of Birth: [birth year]

## 2018-10-09 ENCOUNTER — Ambulatory Visit: Payer: PRIVATE HEALTH INSURANCE | Admitting: Physical Therapy

## 2018-10-09 ENCOUNTER — Encounter: Payer: Self-pay | Admitting: Physical Therapy

## 2018-10-09 DIAGNOSIS — M25512 Pain in left shoulder: Secondary | ICD-10-CM

## 2018-10-09 DIAGNOSIS — M6281 Muscle weakness (generalized): Secondary | ICD-10-CM

## 2018-10-09 DIAGNOSIS — M542 Cervicalgia: Secondary | ICD-10-CM

## 2018-10-09 NOTE — Therapy (Signed)
[place], [place], [address] Phone: [phone]   Fax:  [phone]  Physical Therapy Treatment  Patient Details  Name: Mark Abbott MRN: [id number] Date of Birth: October 04, 1965 Referring Provider (PT): Eunice Blase, MD   Encounter Date: 10/09/2018  PT End of Session - 10/09/18 1323    Visit Number  6    Number of Visits  13    Date for PT Re-Evaluation  10/20/18    Authorization Type  first health    PT Start Time  5176    PT Stop Time  0944    PT Time Calculation (min)  57 min    Activity Tolerance  Patient tolerated treatment well    Behavior During Therapy  [facility] for tasks assessed/performed       Past Medical History:  Diagnosis Date  . Asthma     Past Surgical History:  Procedure Laterality Date  . APPENDECTOMY    . LYMPH NODE BIOPSY      There were no vitals filed for this visit.  Subjective Assessment - 10/09/18 0855    Subjective  Left arm gets numb at times into 3rd and 4 th fingers.    Sleeps with arms over head to take pressure off neck,  Prone     Currently in Pain?  Yes    Pain Score  5     Pain Location  Neck    Pain Orientation  Right;Left;Posterior    Pain Descriptors / Indicators  Numbness   Stiff, sore   Pain Radiating Towards  wakes with numbness in arms and shoulders      Aggravating Factors   constant ,  poor posture    Pain Relieving Factors  muscle relaxer,, ibuprophen  using caution with movement     Effect of Pain on Daily Activities  Light duty    Multiple Pain Sites  --   cramps in legs sometimes,  at night         [facility] PT Assessment - 10/09/18 0001      ROM / Strength   AROM / PROM / Strength  --   grip  right 94, 94,  94,  96,  LT  54, 41, 36  LBS                  [facility] Adult PT Treatment/Exercise - 10/09/18 0001      Self-Care   Self-Care  Posture;ADL's    ADL's  Handout    Posture  sitting and  sleeping       Neck Exercises:  Theraband   Scapula Retraction  10 reps      Shoulder Exercises: Standing   External Rotation  AROM;10 reps    External Rotation Limitations  cued technique      Shoulder Exercises: Pulleys   Flexion Limitations  did not feel good so stopped      Shoulder Exercises: ROM/Strengthening   UBE (Upper Arm Bike)  attempted but did not feel good so stopped      Shoulder Exercises: Stretch   Other Shoulder Stretches  passive pec stretch,  both      Cryotherapy   Number Minutes Cryotherapy  10 Minutes    Cryotherapy Location  Cervical    Type of Cryotherapy  --   cold pack     Manual Therapy   Manual Therapy  Soft tissue mobilization    Manual therapy comments  rigth para scapular,  teres upper  trap passive pec stretch trigger point release,  tissue softened,  light pressure tolerated.  instrument assist at times.     Soft tissue mobilization  mob grade 1-2  1st rib left  feels good,  It is doing sometining.        Neck Exercises: Stretches   Upper Trapezius Stretch  1 rep;Right;Left    Levator Stretch  2 reps;Right;Left    Other Neck Stretches  trunk stretch into rotATIONS STARTING AT HIPS AND ENDING WITH NECK.   3 x right/ left.              PT Education - 10/09/18 1322    Education Details  self care,  exercise form    Person(s) Educated  Patient    Methods  Demonstration;Explanation;Tactile cues;Verbal cues    Comprehension  Verbalized understanding;Returned demonstration          PT Long Term Goals - 10/09/18 1454      PT LONG TERM GOAL #1   Title  Pt will demo full AROM in neck and shoulder.     Baseline  cERVICAL rotations improving.  Shoulder ROM  continues to be limited.     Time  6    Period  Weeks    Status  On-going      PT LONG TERM GOAL #2   Title  Gross GHJ strength to 5/5    Baseline  not assessed due to pain    Time  6    Period  Weeks    Status  Unable to assess      PT LONG TERM GOAL #3   Title  pt will be able to lift and move daily  objects around his house, driving and in the community    Baseline  limited use    Time  6    Period  Weeks    Status  On-going      PT LONG TERM GOAL #4   Title  FOTO to 29% limitation    Time  6    Period  Weeks    Status  Unable to assess            Plan - 10/09/18 1449    Clinical Impression Statement  Pain  varies .  It is improving with posture changes.  Patient continues to meed moderate cues for posture correction.  He tends to keep chin elevated scapular position protracted.  He is open to education and is teachable. Grip left considerably less than right .  Left average 43.66 LBS and right 94.66 LBS.  he is using a TENS and has not gotten the [employer].     PT Next Visit Plan  DN upper trap & pectoralis, periscap activation, continue to decrease fear of movement, gentle soft tissue work, review HEP bands and progress, add functional lifting exercises    PT Home Exercise Plan  supine chin tuck, scap retraction & GHJ flx wand; upper trap & levator stretch, STM with tennis ball    Consulted and Agree with Plan of Care  Patient       Patient will benefit from skilled therapeutic intervention in order to improve the following deficits and impairments:     Visit Diagnosis: Acute pain of left shoulder  Muscle weakness (generalized)  Cervicalgia     Problem List There are no active problems to display for this patient.   HARRIS,KAREN PTA 10/09/2018, 3:00 PM  [place]  [place], [place], [address] Phone: [phone]   Fax:  [phone]  Name: Mark Abbott MRN: [id number] Date of Birth: December 15, 1965

## 2018-10-09 NOTE — Patient Instructions (Signed)

## 2018-10-13 ENCOUNTER — Ambulatory Visit: Payer: PRIVATE HEALTH INSURANCE | Admitting: Physical Therapy

## 2018-10-13 ENCOUNTER — Encounter: Payer: Self-pay | Admitting: Physical Therapy

## 2018-10-13 DIAGNOSIS — M542 Cervicalgia: Secondary | ICD-10-CM

## 2018-10-13 DIAGNOSIS — M25512 Pain in left shoulder: Secondary | ICD-10-CM | POA: Diagnosis not present

## 2018-10-13 DIAGNOSIS — M6281 Muscle weakness (generalized): Secondary | ICD-10-CM

## 2018-10-13 NOTE — Therapy (Signed)
[place], [place], [address] Phone: [phone]   Fax:  [phone]  Physical Therapy Treatment  Patient Details  Name: Mark Abbott MRN: [id number] Date of Birth: 12-Mar-1966 Referring Provider (PT): Eunice Blase, MD   Encounter Date: 10/13/2018  PT End of Session - 10/13/18 0852    Visit Number  7    Number of Visits  13    Date for PT Re-Evaluation  10/20/18    Authorization Type  first health    PT Start Time  [phone]   pt arrived late   PT Stop Time  0930    PT Time Calculation (min)  38 min    Activity Tolerance  Patient tolerated treatment well    Behavior During Therapy  [facility] for tasks assessed/performed       Past Medical History:  Diagnosis Date  . Asthma     Past Surgical History:  Procedure Laterality Date  . APPENDECTOMY    . LYMPH NODE BIOPSY      There were no vitals filed for this visit.  Subjective Assessment - 10/13/18 0852    Subjective  Rt shoulder is very painful. looking up and Right is very painful. since Monday pain has incr.     Patient Stated Goals  decrease pain, get back to day to day    Currently in Pain?  Yes    Pain Score  10-Worst pain ever    Pain Location  Neck    Pain Orientation  Right    Pain Descriptors / Indicators  --   constant                      [facility] Adult PT Treatment/Exercise - 10/13/18 0001      Manual Therapy   Manual Therapy  Joint mobilization;Taping    Manual therapy comments  skilled palpation and monitoring during TPDN    Joint Mobilization  Rt first rib depression, thoracic PA, gross rib ER    Soft tissue mobilization  Rt upper trap    McConnell  rt upper trap inhibition      Neck Exercises: Stretches   Other Neck Stretches  upper trap+1st rib mob with sheet seated       Trigger Point Dry Needling - 10/13/18 1024    Consent Given?  Yes    Education Handout Provided  --   verbal education   Muscles Treated  Upper Body  Upper trapezius    Upper Trapezius Response  Twitch reponse elicited;Palpable increased muscle length   right          PT Education - 10/13/18 1026    Education Details  TPDN & expected outcomes, importance of movement & what to avoid, anatomy of condition    Person(s) Educated  Patient    Methods  Explanation    Comprehension  Verbalized understanding;Need further instruction          PT Long Term Goals - 10/09/18 1454      PT LONG TERM GOAL #1   Title  Pt will demo full AROM in neck and shoulder.     Baseline  cERVICAL rotations improving.  Shoulder ROM  continues to be limited.     Time  6    Period  Weeks    Status  On-going      PT LONG TERM GOAL #2   Title  Gross GHJ strength to 5/5    Baseline  not assessed  due to pain    Time  6    Period  Weeks    Status  Unable to assess      PT LONG TERM GOAL #3   Title  pt will be able to lift and move daily objects around his house, driving and in the community    Baseline  limited use    Time  6    Period  Weeks    Status  On-going      PT LONG TERM GOAL #4   Title  FOTO to 29% limitation    Time  6    Period  Weeks    Status  Unable to assess            Plan - 10/13/18 1026    Clinical Impression Statement  S/s consistent with TOS and decreased with manual therapy today. Pt was able to demo cervical extension and Rt rotation at the end of the session with soreness as expected. Advised that he avoid shrugging his shoulders and extending neck as much as possible. Will continue to benefit from DN so appointments will be RS. Advised that he may be with different therapists but he will greatly benefit.     PT Treatment/Interventions  ADLs/Self Care Home Management;Cryotherapy;Electrical Stimulation;Iontophoresis 4mg /ml Dexamethasone;Moist Heat;Therapeutic activities;Ultrasound;Traction;Therapeutic exercise;Neuromuscular re-education;Patient/family education;Dry needling;Passive range of motion;Manual  techniques;Taping    PT Next Visit Plan  continue DN- upper trap, levator, scalene; lower trap activation    PT Home Exercise Plan  supine chin tuck, scap retraction & GHJ flx wand; upper trap & levator stretch, STM with tennis ball; first rib mob with sheet    Consulted and Agree with Plan of Care  Patient       Patient will benefit from skilled therapeutic intervention in order to improve the following deficits and impairments:  Decreased activity tolerance, Decreased strength, Impaired UE functional use, Pain, Increased muscle spasms, Improper body mechanics, Decreased range of motion, Impaired flexibility, Postural dysfunction  Visit Diagnosis: Acute pain of left shoulder  Muscle weakness (generalized)  Cervicalgia     Problem List There are no active problems to display for this patient.  Mark Laden C. Mark Abbott PT, DPT 10/13/18 10:35 AM   [facility] [address] [place], [place], [address] Phone: [phone]   Fax:  [phone]  Name: Mark Abbott MRN: [id number] Date of Birth: [birth year]

## 2018-10-16 ENCOUNTER — Ambulatory Visit: Payer: PRIVATE HEALTH INSURANCE | Admitting: Physical Therapy

## 2018-10-17 ENCOUNTER — Encounter: Payer: PRIVATE HEALTH INSURANCE | Admitting: Physical Therapy

## 2018-10-20 ENCOUNTER — Encounter: Payer: Self-pay | Admitting: Physical Therapy

## 2018-10-20 ENCOUNTER — Ambulatory Visit: Payer: PRIVATE HEALTH INSURANCE | Admitting: Physical Therapy

## 2018-10-20 ENCOUNTER — Ambulatory Visit (INDEPENDENT_AMBULATORY_CARE_PROVIDER_SITE_OTHER): Payer: PRIVATE HEALTH INSURANCE

## 2018-10-20 ENCOUNTER — Ambulatory Visit (INDEPENDENT_AMBULATORY_CARE_PROVIDER_SITE_OTHER): Payer: PRIVATE HEALTH INSURANCE | Admitting: Family Medicine

## 2018-10-20 ENCOUNTER — Encounter (INDEPENDENT_AMBULATORY_CARE_PROVIDER_SITE_OTHER): Payer: Self-pay | Admitting: Family Medicine

## 2018-10-20 DIAGNOSIS — M542 Cervicalgia: Secondary | ICD-10-CM

## 2018-10-20 DIAGNOSIS — M25512 Pain in left shoulder: Secondary | ICD-10-CM

## 2018-10-20 DIAGNOSIS — M6281 Muscle weakness (generalized): Secondary | ICD-10-CM

## 2018-10-20 MED ORDER — BACLOFEN 10 MG PO TABS: 10.0000 mg | ORAL_TABLET | Freq: Three times a day (TID) | ORAL | 3 refills | Status: DC | PRN

## 2018-10-20 MED ORDER — TRAMADOL HCL 50 MG PO TABS: 50.0000 mg | ORAL_TABLET | Freq: Every evening | ORAL | 0 refills | Status: DC | PRN
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
## 2018-10-20 NOTE — Therapy (Signed)
[place], [place], [address] Phone: [phone]   Fax:  [phone]  Physical Therapy Treatment  Patient Details  Name: Mark Abbott MRN: [id number] Date of Birth: 31-Mar-1966 Referring Provider (PT): Eunice Blase, MD   Encounter Date: 10/20/2018  PT End of Session - 10/20/18 0948    Visit Number  8    Number of Visits  19    Date for PT Re-Evaluation  12/01/18    Authorization Type  first health    PT Start Time  [phone]    PT Stop Time  0946   timed minutes limited to 27 min   PT Time Calculation (min)  60 min    Activity Tolerance  Patient tolerated treatment well    Behavior During Therapy  [facility] for tasks assessed/performed       Past Medical History:  Diagnosis Date  . Asthma     Past Surgical History:  Procedure Laterality Date  . APPENDECTOMY    . LYMPH NODE BIOPSY      There were no vitals filed for this visit.  Subjective Assessment - 10/20/18 0938    Subjective  Pt. reports mild improvement with therapy so far. Current primary complaint is right upper trapezius region pain though continues to have pain in left shoulder. He reports dry needling last session was helpful.    Limitations  House hold activities;Sitting;Lifting    Diagnostic tests  X-rays    Patient Stated Goals  decrease pain, get back to day to day    Currently in Pain?  Yes    Pain Score  8     Pain Location  Neck    Pain Orientation  Right    Pain Descriptors / Indicators  Tightness   "constant"   Pain Type  --   acute on chronic   Pain Radiating Towards  right upper trapezius    Pain Frequency  Constant    Aggravating Factors   posture, reports pain constant/no aggs otherwise    Pain Relieving Factors  medication    Effect of Pain on Daily Activities  Light duty     Multiple Pain Sites  Yes    Pain Score  7    Pain Location  Shoulder    Pain Orientation  Left    Pain Descriptors / Indicators  Constant     Pain Onset  More than a month ago    Pain Frequency  Constant    Aggravating Factors   activity, reaching    Pain Relieving Factors  rest, medication    Effect of Pain on Daily Activities  limits ability reaching and lifting activities         [facility] PT Assessment - 10/20/18 0001      Observation/Other Assessments   Focus on Therapeutic Outcomes (FOTO)   50% Limited      ROM / Strength   AROM / PROM / Strength  Strength      AROM   AROM Assessment Site  Shoulder;Cervical    Left Shoulder Flexion  120 Degrees    Left Shoulder ABduction  90 Degrees    Left Shoulder Internal Rotation  --   IR reach to left glut   Left Shoulder External Rotation  50 Degrees   at 0 deg abd   Cervical Flexion  20    Cervical Extension  15    Cervical - Right Side Bend  20    Cervical - Left Side  Bend  15    Cervical - Right Rotation  60    Cervical - Left Rotation  70      Strength   Overall Strength Comments  Bilat. UE grossly 4/5 for MMTs, right grip 82 lbs., left grip 88 lbs.                   [place] Adult PT Treatment/Exercise - 10/20/18 0001      Shoulder Exercises: Supine   Horizontal ABduction  Strengthening;Both;15 reps    Theraband Level (Shoulder Horizontal ABduction)  Level 2 (Red)    External Rotation  Strengthening;Both;15 reps      Shoulder Exercises: Seated   Extension  Right;15 reps   with scapular depression   Theraband Level (Shoulder Extension)  Level 2 (Red)    Row  Both;15 reps    Theraband Level (Shoulder Row)  Level 3 (Green)      Manual Therapy   Manual Therapy  Joint mobilization;Soft tissue mobilization    Manual therapy comments  skilled palpation of trigger points    Joint Mobilization  Right first rib depression, thoracic PAs grade I-III    Soft tissue mobilization  STM right upper trapezius and levator region      Neck Exercises: Stretches   Upper Trapezius Stretch  Right;3 reps;30 seconds    Levator Stretch  Right;3 reps;30 seconds        Trigger Point Dry Needling - 10/20/18 0923    Consent Given?  Yes    Muscles Treated Upper Body  Upper trapezius;Levator scapulae   incl. right splenius capitus C4-5 region   Upper Trapezius Response  Twitch reponse elicited    Levator Scapulae Response  Twitch response elicited           PT Education - 10/20/18 0947    Education Details  POC, dry needling    Person(s) Educated  Patient    Methods  Explanation    Comprehension  Verbalized understanding          PT Long Term Goals - 10/20/18 0951      PT LONG TERM GOAL #1   Title  Pt will demo full AROM in neck and shoulder.     Baseline  see flowsheet-improved from baseline but still limited    Time  6    Period  Weeks    Status  On-going    Target Date  12/01/18      PT LONG TERM GOAL #2   Title  Gross GHJ strength to 5/5    Baseline  grossly 4/5    Time  6    Period  Weeks    Status  On-going    Target Date  12/01/18      PT LONG TERM GOAL #3   Title  pt will be able to lift and move daily objects around his house, driving and in the community    Baseline  Mild improvement but still limited    Time  6    Period  Weeks    Status  On-going    Target Date  12/01/18      PT LONG TERM GOAL #4   Title  FOTO to 29% limitation    Baseline  50% limited    Time  6    Period  Weeks    Status  On-going            Plan - 10/20/18 0949    Clinical Impression Statement  Mild  improvement/fair progress with therapy to date. In recent PT pt. had s/s which were consistent with possible TOS but today symptoms appear myofascial in etiology for right upper trapezius region with associated trigger points. Shoulder ROM improved from baseline but still with associated pain and weakness which could be concerning for rotator cuff pathology. Given patient has noted benefit with therapy plan recertification to continue, he will follow up with MD as scheduled for further assessment as needed given ongoing pain and  limitations.    History and Personal Factors relevant to plan of care:  heavy lifting at work, h/o shoulder pain    Clinical Presentation  Stable    Clinical Decision Making  Low    Rehab Potential  Good    PT Frequency  2x / week    PT Duration  6 weeks    PT Treatment/Interventions  ADLs/Self Care Home Management;Cryotherapy;Electrical Stimulation;Iontophoresis 4mg /ml Dexamethasone;Moist Heat;Therapeutic activities;Ultrasound;Traction;Therapeutic exercise;Neuromuscular re-education;Patient/family education;Dry needling;Passive range of motion;Manual techniques;Taping;Vasopneumatic Device    PT Next Visit Plan  continue DN- upper trap, levator, scalene; lower trap activation    PT Home Exercise Plan  supine chin tuck, scap retraction & GHJ flx wand; upper trap & levator stretch, STM with tennis ball; first rib mob with sheet    Consulted and Agree with Plan of Care  Patient       Patient will benefit from skilled therapeutic intervention in order to improve the following deficits and impairments:  Decreased activity tolerance, Decreased strength, Impaired UE functional use, Pain, Increased muscle spasms, Improper body mechanics, Decreased range of motion, Impaired flexibility, Postural dysfunction  Visit Diagnosis: Acute pain of left shoulder  Muscle weakness (generalized)  Cervicalgia     Problem List There are no active problems to display for this patient.   Beaulah Dinning, PT, DPT 10/20/18 9:59 AM  [facility] [address] [place], [place], [address] Phone: [phone]   Fax:  [phone]  Name: Mark Abbott MRN: [id number] Date of Birth: 1966-09-18

## 2018-10-23 ENCOUNTER — Other Ambulatory Visit (INDEPENDENT_AMBULATORY_CARE_PROVIDER_SITE_OTHER): Payer: Self-pay | Admitting: Family Medicine

## 2018-10-23 ENCOUNTER — Telehealth (INDEPENDENT_AMBULATORY_CARE_PROVIDER_SITE_OTHER): Payer: Self-pay | Admitting: Family Medicine

## 2018-10-23 ENCOUNTER — Encounter: Payer: Self-pay | Admitting: Physical Therapy

## 2018-10-23 MED ORDER — TRAMADOL HCL 50 MG PO TABS: 50.0000 mg | ORAL_TABLET | Freq: Two times a day (BID) | ORAL | 0 refills | Status: DC | PRN

## 2018-10-23 MED ORDER — IBUPROFEN 800 MG PO TABS
800.0000 mg | ORAL_TABLET | Freq: Three times a day (TID) | ORAL | 1 refills | Status: DC | PRN
Start: 2018-10-23 — End: 2021-05-26

## 2018-10-23 NOTE — Telephone Encounter (Signed)
Patinet is constantly calling and yelling on voicemail in regards to his medication. I called the number he was leaving and wife picked up phone and I advised her of previous note. Explained we couldn't release any information to her dur to no DPR on file.  She advised to call him on his [phone]

## 2018-10-23 NOTE — Telephone Encounter (Signed)
Please advise 

## 2018-10-23 NOTE — Telephone Encounter (Signed)
I called and advised the patient both Tramadol and Ibuprofen 800 mg were sent in to his pharmacy.

## 2018-10-23 NOTE — Telephone Encounter (Signed)
Please see other message on this from this afternoon - meds were sent in to his pharmacy.

## 2018-10-23 NOTE — Telephone Encounter (Signed)
I called the [facility] on [address] to double-check on the quantity of Tramadol that was picked up on 10/20/18. He did only get #5, as the initial Rx for any opioids/Tramadol can only be for 5 days. He can now get a full Rx.  The patient said he would like the stronger mg of Ibuprofen (800 mg) - he currently takes 600 mg.  He said that if he cannot get more Tramadol, he will take the Ibuprofen 800.

## 2018-10-23 NOTE — Telephone Encounter (Signed)
Rx sent 

## 2018-10-23 NOTE — Telephone Encounter (Signed)
Left message on the patient's voice mail to call me back. There is no signed ROI for this office in the chart, so I cannot speak to [place] regarding the patient's medication. Will await a return phone call.

## 2018-10-23 NOTE — Telephone Encounter (Signed)
Patient's wife Adela Lank) called stating the pharmacy only gave the patient 5 tabs of the (Tramadol) Shelia said the pharmacy need auth in order to dispense any more tabs. The number to contact Adela Lank is [phone]

## 2018-10-24 ENCOUNTER — Telehealth: Payer: Self-pay | Admitting: Physical Therapy

## 2018-10-24 ENCOUNTER — Ambulatory Visit: Payer: PRIVATE HEALTH INSURANCE | Attending: Family Medicine | Admitting: Physical Therapy

## 2018-10-24 DIAGNOSIS — M25512 Pain in left shoulder: Secondary | ICD-10-CM | POA: Insufficient documentation

## 2018-10-24 DIAGNOSIS — M6281 Muscle weakness (generalized): Secondary | ICD-10-CM | POA: Insufficient documentation

## 2018-10-24 DIAGNOSIS — M542 Cervicalgia: Secondary | ICD-10-CM | POA: Insufficient documentation

## 2018-10-24 NOTE — Telephone Encounter (Signed)
Spoke with pt about missed appointment today at 8 am. He stated he saw his MD and that they wanted to get him set up for an MRI and to hold off on PT until the imaging is done. Discussed importance of calling next time to cancel appointment to prevent it from counting against him. He doesn't have his MRI scheduled yet, so plan to cancel out remaining visits. Discussed that once the imaging is done, to call us back in order to get back on the schedule.

## 2018-10-27 ENCOUNTER — Ambulatory Visit: Payer: PRIVATE HEALTH INSURANCE | Admitting: Physical Therapy

## 2018-10-27 ENCOUNTER — Encounter: Payer: Self-pay | Admitting: Physical Therapy

## 2018-10-29 ENCOUNTER — Ambulatory Visit
Admission: RE | Admit: 2018-10-29 | Discharge: 2018-10-29 | Disposition: A | Discharge status: Home / Self Care | Payer: PRIVATE HEALTH INSURANCE | Source: Ambulatory Visit | Attending: Family Medicine | Admitting: Family Medicine

## 2018-10-29 DIAGNOSIS — M542 Cervicalgia: Secondary | ICD-10-CM

## 2018-10-29 DIAGNOSIS — M25512 Pain in left shoulder: Secondary | ICD-10-CM

## 2018-10-30 ENCOUNTER — Telehealth (INDEPENDENT_AMBULATORY_CARE_PROVIDER_SITE_OTHER): Payer: Self-pay | Admitting: Family Medicine

## 2018-10-30 DIAGNOSIS — M542 Cervicalgia: Secondary | ICD-10-CM

## 2018-10-30 NOTE — Telephone Encounter (Signed)
Will treat conservatively per Dr. Marlou Sa (shoulder issue).

## 2018-10-30 NOTE — Telephone Encounter (Signed)
MRI shows a right-sided abnormality at C3-4 which is most likely a disc protrusion.  It's not causing any left-sided compression, so is most likely not related to your pain.  Radiologist thinks there's a small possibility it could be something else besides a disc, so I think it would be best to consult with a spine specialist to monitor this.  Shoulder has no rotator cuff tear, but there is inflammation of tendons.  One muscle has some swelling which could indicate irritation of the axillary nerve.  I am going to ask one of our surgeons about this.  Might simply need therapy, but occasionally could need surgery.

## 2018-10-31 ENCOUNTER — Ambulatory Visit: Payer: PRIVATE HEALTH INSURANCE | Admitting: Physical Therapy

## 2018-11-02 ENCOUNTER — Encounter: Payer: PRIVATE HEALTH INSURANCE | Admitting: Physical Therapy

## 2018-11-02 NOTE — Telephone Encounter (Signed)
Left message on patient's home voice mail to call back regarding MRI results & plan.

## 2018-11-02 NOTE — Telephone Encounter (Signed)
Results of the neck and shoulder MRIs given to the patient.  He has already been scheduled to see a neck/spine specialist in March.  He will call PT to get back on their schedule. He would like to try the cortisone injection for his shoulder - appointment scheduled for 8am on 11/06/2018 with Dr. Junius Roads for this.

## 2018-11-02 NOTE — Telephone Encounter (Signed)
Pt called back sent to [facility].

## 2018-11-06 ENCOUNTER — Ambulatory Visit (INDEPENDENT_AMBULATORY_CARE_PROVIDER_SITE_OTHER): Payer: Self-pay | Admitting: Family Medicine

## 2018-11-07 ENCOUNTER — Ambulatory Visit (INDEPENDENT_AMBULATORY_CARE_PROVIDER_SITE_OTHER): Payer: PRIVATE HEALTH INSURANCE | Admitting: Family Medicine

## 2018-11-07 ENCOUNTER — Encounter (INDEPENDENT_AMBULATORY_CARE_PROVIDER_SITE_OTHER): Payer: Self-pay | Admitting: Family Medicine

## 2018-11-07 ENCOUNTER — Encounter: Payer: PRIVATE HEALTH INSURANCE | Admitting: Physical Therapy

## 2018-11-07 DIAGNOSIS — M25512 Pain in left shoulder: Secondary | ICD-10-CM | POA: Diagnosis not present
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
## 2018-11-09 ENCOUNTER — Encounter: Payer: PRIVATE HEALTH INSURANCE | Admitting: Physical Therapy

## 2018-11-14 ENCOUNTER — Encounter: Payer: Self-pay | Admitting: Physical Therapy

## 2018-11-14 ENCOUNTER — Other Ambulatory Visit (INDEPENDENT_AMBULATORY_CARE_PROVIDER_SITE_OTHER): Payer: Self-pay | Admitting: Family Medicine

## 2018-11-14 ENCOUNTER — Ambulatory Visit: Payer: PRIVATE HEALTH INSURANCE | Admitting: Physical Therapy

## 2018-11-14 DIAGNOSIS — M6281 Muscle weakness (generalized): Secondary | ICD-10-CM | POA: Diagnosis present

## 2018-11-14 DIAGNOSIS — M542 Cervicalgia: Secondary | ICD-10-CM

## 2018-11-14 DIAGNOSIS — M25512 Pain in left shoulder: Secondary | ICD-10-CM

## 2018-11-14 NOTE — Therapy (Signed)
[place], [place], [address] Phone: [phone]   Fax:  [phone]  Physical Therapy Treatment/Re-assessment   Patient Details  Name: Mark Abbott MRN: [id number] Date of Birth: 04/23/1966 Referring Provider (PT): Eunice Blase, MD   Encounter Date: 11/14/2018  PT End of Session - 11/14/18 1532    Visit Number  9    Number of Visits  17    Date for PT Re-Evaluation  12/12/18    Authorization Type  first health    PT Start Time  0800    PT Stop Time  0840    PT Time Calculation (min)  40 min    Activity Tolerance  Patient tolerated treatment well    Behavior During Therapy  [facility] for tasks assessed/performed       Past Medical History:  Diagnosis Date  . Asthma     Past Surgical History:  Procedure Laterality Date  . APPENDECTOMY    . LYMPH NODE BIOPSY      There were no vitals filed for this visit.  Subjective Assessment - 11/14/18 0803    Subjective  Patient returns to therapy following and MRI on his neck and and shoulder. He feels like his shoulder is much better. He is having pain in his neck at this time.     Limitations  House hold activities;Sitting;Lifting    Diagnostic tests  X-rays    Patient Stated Goals  decrease pain, get back to day to day    Pain Score  7     Pain Location  Neck    Pain Orientation  Left    Pain Descriptors / Indicators  Aching    Pain Radiating Towards  bilateral upper traps     Pain Frequency  Constant    Aggravating Factors   Posture, pain with use of the shoulder and turning the neck     Pain Relieving Factors  medication     Effect of Pain on Daily Activities  on light duty at work     Pain Score  --   Not hurting as much in the shoulder but popping and clincking         [facility] PT Assessment - 11/14/18 0001      AROM   Left Shoulder Flexion  95 Degrees    Left Shoulder ABduction  110 Degrees    Left Shoulder Internal Rotation  --   to L4 with  pain    Left Shoulder External Rotation  --   painful with external rotation    Cervical Flexion  30    Cervical Extension  25    Cervical - Right Rotation  75    Cervical - Left Rotation  80                   [facility] Adult PT Treatment/Exercise - 11/14/18 0001      Neck Exercises: Theraband   Scapula Retraction Limitations  2x10 yellow kicked him down     Shoulder Extension Limitations  2x10 yellow band       Shoulder Exercises: Supine   Other Supine Exercises  wand ER x10 with mod cuing     Other Supine Exercises  shoulder flexion x10       Manual Therapy   Manual Therapy  Joint mobilization;Soft tissue mobilization;Passive ROM    Manual therapy comments  skilled palpation of trigger points    Joint Mobilization  PA and inferior glides to left  shoulder     Soft tissue mobilization  STM right upper trapezius and levator region    McConnell  Gentle PROM of shoulder with emphasis on ER and flexion              PT Education - 11/14/18 0834    Education Details  reviewed POC; symptom mangement     Person(s) Educated  Patient    Methods  Explanation;Demonstration;Tactile cues;Verbal cues    Comprehension  Verbalized understanding;Returned demonstration;Verbal cues required;Tactile cues required          PT Long Term Goals - 11/14/18 1538      PT LONG TERM GOAL #1   Title  Pt will demo full AROM in neck and shoulder.     Baseline  see flowsheet-improved from baseline but still limited    Time  6    Period  Weeks    Status  On-going      PT LONG TERM GOAL #2   Title  Gross GHJ strength to 5/5    Baseline  grossly 4/5    Time  6    Period  Weeks    Status  On-going      PT LONG TERM GOAL #3   Title  pt will be able to lift and move daily objects around his house, driving and in the community    Baseline  Mild improvement but still limited    Time  6    Period  Weeks    Status  On-going      PT LONG TERM GOAL #4   Title  FOTO to 29% limitation     Baseline  50% limited (11/14/2018)     Time  6    Period  Weeks    Status  On-going            Plan - 11/14/18 1534    Clinical Impression Statement  Therapy perfromed a re-assessment on the patient 2nd to not being to see PT for several weeks an being back to the MD for imaging. Per patient MD reccomedned continued PT for his shoulder. He will be going to see Dr Ellene Route for his neck. He has improved cervical motion after his shot suprisingly. His shoulder active ROM is about the same. He is very gaurded withhis exercises and with range of motion. He can relax with cuing. Therapy talked to him about reccognizing when his shoulders are up and breathing with exercises. Therapy reviewed relaxation techniuqes. He would benefit from further PT 2w4.     Clinical Presentation  Stable    Clinical Decision Making  Low    Rehab Potential  Good    PT Frequency  2x / week    PT Duration  4 weeks    PT Treatment/Interventions  ADLs/Self Care Home Management;Cryotherapy;Electrical Stimulation;Iontophoresis 4mg /ml Dexamethasone;Moist Heat;Therapeutic activities;Ultrasound;Traction;Therapeutic exercise;Neuromuscular re-education;Patient/family education;Dry needling;Passive range of motion;Manual techniques;Taping;Vasopneumatic Device    PT Next Visit Plan  continue DN- upper trap, levator, scalene; lower trap activation; review technique with ther-ex     PT Home Exercise Plan  supine chin tuck, scap retraction & GHJ flx wand; upper trap & levator stretch, STM with tennis ball; first rib mob with sheet    Consulted and Agree with Plan of Care  Patient       Patient will benefit from skilled therapeutic intervention in order to improve the following deficits and impairments:  Decreased activity tolerance, Decreased strength, Impaired UE functional use, Pain, Increased muscle spasms, Improper body  mechanics, Decreased range of motion, Impaired flexibility, Postural dysfunction  Visit Diagnosis: Acute  pain of left shoulder - Plan: PT plan of care cert/re-cert  Muscle weakness (generalized) - Plan: PT plan of care cert/re-cert  Cervicalgia - Plan: PT plan of care cert/re-cert     Problem List There are no active problems to display for this patient.   Carney Living PT DPT  11/14/2018, 4:00 PM  [facility] [address] [place], [place], [address] Phone: [phone]   Fax:  [phone]  Name: Mark Abbott MRN: [id number] Date of Birth: 10/27/1965

## 2018-11-21 ENCOUNTER — Ambulatory Visit: Payer: PRIVATE HEALTH INSURANCE | Attending: Family Medicine | Admitting: Physical Therapy

## 2018-11-21 ENCOUNTER — Encounter: Payer: Self-pay | Admitting: Physical Therapy

## 2018-11-21 DIAGNOSIS — M542 Cervicalgia: Secondary | ICD-10-CM | POA: Diagnosis present

## 2018-11-21 DIAGNOSIS — M25512 Pain in left shoulder: Secondary | ICD-10-CM | POA: Diagnosis present

## 2018-11-21 DIAGNOSIS — M6281 Muscle weakness (generalized): Secondary | ICD-10-CM | POA: Diagnosis present

## 2018-11-21 NOTE — Therapy (Signed)
[place], [place], [address] Phone: [phone]   Fax:  [phone]  Physical Therapy Treatment  Patient Details  Name: Mark Abbott MRN: [id number] Date of Birth: [birth year] Referring Provider (PT): Eunice Blase, MD   Encounter Date: 11/21/2018  PT End of Session - 11/21/18 0804    Visit Number  10    Number of Visits  17    Date for PT Re-Evaluation  12/12/18    Authorization Type  first health    PT Start Time  0800    PT Stop Time  0850    PT Time Calculation (min)  50 min       Past Medical History:  Diagnosis Date  . Asthma     Past Surgical History:  Procedure Laterality Date  . APPENDECTOMY    . LYMPH NODE BIOPSY      There were no vitals filed for this visit.  Subjective Assessment - 11/21/18 0802    Subjective  I beleive the TPDN helped and the muscle relaxers. Overall pain is much better in neck now focusing on the shoulder. A little shoulder pain. I just have to watch when I lay down and sit.     Pain Score  --   not much   Pain Location  Shoulder    Pain Orientation  Left    Pain Descriptors / Indicators  Discomfort    Aggravating Factors   comes and goes, laying, sitting    Pain Relieving Factors  rest, stay off of it when sleeping    Effect of Pain on Daily Activities            [facility] PT Assessment - 11/21/18 0001      ROM / Strength   AROM / PROM / Strength  PROM      AROM   Left Shoulder Flexion  105 Degrees      PROM   PROM Assessment Site  Shoulder    Right/Left Shoulder  Left    Left Shoulder Flexion  155 Degrees    Left Shoulder ABduction  125 Degrees    Left Shoulder Internal Rotation  70 Degrees   AT 45 DEG abduction   Left Shoulder External Rotation  85 Degrees   at 90 degrees abduction                  [facility] Adult PT Treatment/Exercise - 11/21/18 0001      Shoulder Exercises: Standing   External Rotation  10 reps;Left   2 sets   Theraband Level (Shoulder External Rotation)  Level 2 (Red)    Internal Rotation  10 reps   2 sets   Theraband Level (Shoulder Internal Rotation)  Level 2 (Red)    Flexion  10 reps   2 sets    Theraband Level (Shoulder Flexion)  Level 2 (Red)    Extension  20 reps    Theraband Level (Shoulder Extension)  Level 3 (Green)    Row  20 reps    Theraband Level (Shoulder Row)  Level 3 (Green)      Shoulder Exercises: Pulleys   Flexion  2 minutes      Shoulder Exercises: ROM/Strengthening   UBE (Upper Arm Bike)  UBE level 2 3 min forward and backward     Other ROM/Strengthening Exercises  standing cane flexion, abduction, IR, extension       Shoulder Exercises: Stretch   Corner Stretch  10 seconds;4 reps  Internal Rotation Stretch  3 reps      Modalities   Modalities  Moist Heat      Moist Heat Therapy   Number Minutes Moist Heat  10 Minutes    Moist Heat Location  Shoulder   left     Manual Therapy   Joint Mobilization  --    McConnell  Gentle PROM of shoulder with emphasis on ER and flexion       Neck Exercises: Stretches   Upper Trapezius Stretch  Right;3 reps;30 seconds    Levator Stretch  Right;3 reps;30 seconds                  PT Long Term Goals - 11/14/18 1538      PT LONG TERM GOAL #1   Title  Pt will demo full AROM in neck and shoulder.     Baseline  see flowsheet-improved from baseline but still limited    Time  6    Period  Weeks    Status  On-going      PT LONG TERM GOAL #2   Title  Gross GHJ strength to 5/5    Baseline  grossly 4/5    Time  6    Period  Weeks    Status  On-going      PT LONG TERM GOAL #3   Title  pt will be able to lift and move daily objects around his house, driving and in the community    Baseline  Mild improvement but still limited    Time  6    Period  Weeks    Status  On-going      PT LONG TERM GOAL #4   Title  FOTO to 29% limitation    Baseline  50% limited (11/14/2018)     Time  6    Period  Weeks     Status  On-going            Plan - 11/21/18 0805    Clinical Impression Statement  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 Progressed wtih scapular stab and GHJ strengthening with pt requiring cues to breath and relex upper traps. He did well with all therex today demonstrating hesitation to move through full ROM. Cues to relax throughout PROM with some end range pain with ER/IR. AFter HMP he reports feeling better.     PT Next Visit Plan  continue DN- upper trap, levator, scalene; lower trap activation; review technique with ther-ex     PT Home Exercise Plan  supine chin tuck, scap retraction & GHJ flx wand; upper trap & levator stretch, STM with tennis ball; first rib mob with sheet       Patient will benefit from skilled therapeutic intervention in order to improve the following deficits and impairments:  Decreased activity tolerance, Decreased strength, Impaired UE functional use, Pain, Increased muscle spasms, Improper body mechanics, Decreased range of motion, Impaired flexibility, Postural dysfunction  Visit Diagnosis: Acute pain of left shoulder  Muscle weakness (generalized)  Cervicalgia     Problem List There are no active problems to display for this patient.   Hessie Diener Pearson, [place] 11/21/2018, 9:29 AM  [facility] [address] [place], [place], [address] Phone: [phone]   Fax:  [phone]  Name: Mark Abbott MRN: [id number] Date of Birth: 02/08/1966

## 2018-11-23 ENCOUNTER — Encounter: Payer: Self-pay | Admitting: Physical Therapy

## 2018-11-23 ENCOUNTER — Ambulatory Visit: Payer: PRIVATE HEALTH INSURANCE | Admitting: Physical Therapy

## 2018-11-23 DIAGNOSIS — M6281 Muscle weakness (generalized): Secondary | ICD-10-CM

## 2018-11-23 DIAGNOSIS — M542 Cervicalgia: Secondary | ICD-10-CM

## 2018-11-23 DIAGNOSIS — M25512 Pain in left shoulder: Secondary | ICD-10-CM

## 2018-11-23 NOTE — Therapy (Signed)
[place], [place], [address] Phone: [phone]   Fax:  [phone]  Physical Therapy Treatment  Patient Details  Name: Mark Abbott MRN: [id number] Date of Birth: Jun 05, 1966 Referring Provider (PT): Eunice Blase, MD   Encounter Date: 11/23/2018  PT End of Session - 11/23/18 5638    Visit Number  11    Number of Visits  17    Date for PT Re-Evaluation  12/12/18    Authorization Type  first health    PT Start Time  0800    PT Stop Time  0843    PT Time Calculation (min)  43 min    Activity Tolerance  Patient tolerated treatment well    Behavior During Therapy  [facility] for tasks assessed/performed       Past Medical History:  Diagnosis Date  . Asthma     Past Surgical History:  Procedure Laterality Date  . APPENDECTOMY    . LYMPH NODE BIOPSY      There were no vitals filed for this visit.  Subjective Assessment - 11/23/18 0804    Subjective  Patient reports the shoulder is still cracking but it is imprpving. 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 He has been working on his stretches and exercises.     Limitations  House hold activities;Sitting;Lifting    Diagnostic tests  X-rays    Patient Stated Goals  decrease pain, get back to day to day    Currently in Pain?  Yes    Pain Score  7     Pain Location  Shoulder    Pain Orientation  Anterior    Pain Descriptors / Indicators  Aching    Pain Type  Chronic pain    Aggravating Factors   pain using the shoulder     Pain Relieving Factors  pain mediciation     Effect of Pain on Daily Activities  on light duty at work                        [facility] Adult PT Treatment/Exercise - 11/23/18 0001      Shoulder Exercises: Supine   Other Supine Exercises  wand ER x10 with mod cuing     Other Supine Exercises  shoulder flexion x10       Shoulder Exercises: Standing   External Rotation  15 reps;Left    Theraband Level  (Shoulder External Rotation)  Level 2 (Red)    Internal Rotation  15 reps;Left    Theraband Level (Shoulder Internal Rotation)  Level 2 (Red)    Extension  20 reps    Theraband Level (Shoulder Extension)  Level 3 (Green)    Row  20 reps    Theraband Level (Shoulder Row)  Level 3 (Green)    Other Standing Exercises  Ue ranger       Shoulder Exercises: Pulleys   Flexion  2 minutes      Shoulder Exercises: Stretch   Corner Stretch  10 seconds;4 reps      Modalities   Modalities  Iontophoresis      Iontophoresis   Type of Iontophoresis  Dexamethasone    Location  left anteriror shoulder     Dose  1 cc     Time  4-6 hour slow release       Manual Therapy   Manual Therapy  Joint mobilization;Soft tissue mobilization;Passive ROM    Manual  therapy comments  skilled palpation of trigger points    Joint Mobilization  PA and inferior glides to left shoulder     Soft tissue mobilization  STM right upper trapezius and levator region    McConnell  Gentle PROM of shoulder with emphasis on ER and flexion              PT Education - 11/23/18 7494    Education Details  HEP, symptom mangement     Person(s) Educated  Patient    Methods  Explanation;Demonstration;Tactile cues;Verbal cues    Comprehension  Verbalized understanding;Returned demonstration;Verbal cues required;Tactile cues required;Need further instruction          PT Long Term Goals - 11/23/18 2115      PT LONG TERM GOAL #1   Title  Pt will demo full AROM in neck and shoulder.     Baseline  see flowsheet-improved from baseline but still limited    Time  6    Period  Weeks    Status  On-going      PT LONG TERM GOAL #2   Title  Gross GHJ strength to 5/5    Baseline  grossly 4/5    Time  6    Period  Weeks    Status  On-going      PT LONG TERM GOAL #3   Title  pt will be able to lift and move daily objects around his house, driving and in the community    Baseline  Mild improvement but still limited    Time   6    Period  Weeks    Status  On-going      PT LONG TERM GOAL #4   Title  FOTO to 29% limitation    Baseline  50% limited (11/14/2018)     Time  6    Period  Weeks    Status  On-going            Plan - 11/23/18 2110    Clinical Impression Statement  Patient has made progress he is able to use his arm more with less pain. He appears to be having some anterior tendinitis. He has tednerness in his bicpes groove and around the supraspinatus insertion. Therapy trialed Iontophoriesis. He was encouaged to continue with his exercises at home. He required min cuing for exercises today.     Clinical Decision Making  Low    Rehab Potential  Good    PT Frequency  2x / week    PT Duration  4 weeks    PT Treatment/Interventions  ADLs/Self Care Home Management;Cryotherapy;Electrical Stimulation;Iontophoresis 4mg /ml Dexamethasone;Moist Heat;Therapeutic activities;Ultrasound;Traction;Therapeutic exercise;Neuromuscular re-education;Patient/family education;Dry needling;Passive range of motion;Manual techniques;Taping;Vasopneumatic Device    PT Next Visit Plan  continue DN- upper trap, levator, scalene; lower trap activation; review technique with ther-ex     PT Home Exercise Plan  supine chin tuck, scap retraction & GHJ flx wand; upper trap & levator stretch, STM with tennis ball; first rib mob with sheet    Consulted and Agree with Plan of Care  Patient       Patient will benefit from skilled therapeutic intervention in order to improve the following deficits and impairments:  Decreased activity tolerance, Decreased strength, Impaired UE functional use, Pain, Increased muscle spasms, Improper body mechanics, Decreased range of motion, Impaired flexibility, Postural dysfunction  Visit Diagnosis: Acute pain of left shoulder  Muscle weakness (generalized)  Cervicalgia     Problem List There are no active problems to  display for this patient.   Carney Living PT DPT  11/23/2018, 9:21  PM  [facility] [address] [place], [place], [address] Phone: [phone]   Fax:  [phone]  Name: Mark Abbott MRN: [id number] Date of Birth: [birth year]

## 2018-11-28 ENCOUNTER — Ambulatory Visit: Payer: PRIVATE HEALTH INSURANCE | Admitting: Physical Therapy

## 2018-11-30 ENCOUNTER — Ambulatory Visit: Payer: PRIVATE HEALTH INSURANCE | Admitting: Physical Therapy

## 2018-11-30 ENCOUNTER — Encounter: Payer: Self-pay | Admitting: Physical Therapy

## 2018-11-30 ENCOUNTER — Other Ambulatory Visit: Payer: Self-pay

## 2018-11-30 DIAGNOSIS — M25512 Pain in left shoulder: Secondary | ICD-10-CM | POA: Diagnosis not present

## 2018-11-30 DIAGNOSIS — M6281 Muscle weakness (generalized): Secondary | ICD-10-CM

## 2018-11-30 DIAGNOSIS — M542 Cervicalgia: Secondary | ICD-10-CM

## 2018-11-30 NOTE — Therapy (Addendum)
[place], [place], [address] Phone: [phone]   Fax:  [phone]  Physical Therapy Treatment/Discharge   Patient Details  Name: Mark Abbott MRN: [id number] Date of Birth: Aug 18, 1966 Referring Provider (PT): Eunice Blase, MD   Encounter Date: 11/30/2018  PT End of Session - 11/30/18 1348    Visit Number  12    Number of Visits  17    Date for PT Re-Evaluation  12/12/18    Authorization Type  first health    PT Start Time  0800    PT Stop Time  0839    PT Time Calculation (min)  39 min    Activity Tolerance  Patient tolerated treatment well    Behavior During Therapy  [facility] for tasks assessed/performed       Past Medical History:  Diagnosis Date  . Asthma     Past Surgical History:  Procedure Laterality Date  . APPENDECTOMY    . LYMPH NODE BIOPSY      There were no vitals filed for this visit.  Subjective Assessment - 11/30/18 0820    Subjective  Patient reports his shoulder is feeling better. He still reports 6/10 pain but he reports he is using it more and the pain has improved.     Limitations  House hold activities;Sitting;Lifting    Diagnostic tests  X-rays    Patient Stated Goals  decrease pain, get back to day to day    Currently in Pain?  Yes    Pain Score  6     Pain Location  Shoulder    Pain Orientation  Anterior    Pain Descriptors / Indicators  Aching    Pain Type  Chronic pain    Pain Radiating Towards  bilateral upper traps     Pain Onset  More than a month ago    Pain Frequency  Constant    Aggravating Factors   pain using his shoulder     Pain Relieving Factors  pain medication     Effect of Pain on Daily Activities  back to work                        [facility] Adult PT Treatment/Exercise - 11/30/18 0001      Shoulder Exercises: Supine   Other Supine Exercises  wand flexion 2x10       Shoulder Exercises: Sidelying   Other Sidelying Exercises   sideyling ER x15      Shoulder Exercises: Standing   External Rotation  15 reps;Left    Theraband Level (Shoulder External Rotation)  Level 2 (Red)    Internal Rotation  15 reps;Left    Theraband Level (Shoulder Internal Rotation)  Level 2 (Red)    Extension  20 reps    Theraband Level (Shoulder Extension)  Level 3 (Green)    Row  20 reps    Theraband Level (Shoulder Row)  Level 3 (Green)    Other Standing Exercises  Ue ranger 2x10      Shoulder Exercises: Pulleys   Flexion  2 minutes             PT Education - 11/30/18 0831    Education Details  reviewed the importance of leaving his shoulders down.     Person(s) Educated  Patient    Methods  Explanation;Demonstration    Comprehension  Verbalized understanding;Returned demonstration;Verbal cues required  PT Long Term Goals - 11/30/18 1350      PT LONG TERM GOAL #1   Title  Pt will demo full AROM in neck and shoulder.     Baseline  see flowsheet-improved from baseline but still limited    Time  6    Period  Weeks    Status  On-going      PT LONG TERM GOAL #2   Title  Gross GHJ strength to 5/5    Baseline  grossly 4/5    Time  6    Period  Weeks    Status  On-going      PT LONG TERM GOAL #3   Title  pt will be able to lift and move daily objects around his house, driving and in the community    Baseline  Mild improvement but still limited    Time  6    Period  Weeks    Status  On-going      PT LONG TERM GOAL #4   Title  FOTO to 29% limitation    Baseline  50% limited (11/14/2018)     Time  6    Period  Weeks    Status  On-going            Plan - 11/30/18 1349    Clinical Impression Statement  Patient is making progress. He requires frequent cuing to drop his shoulder with exercises though. He also needs cuing to breathe. His shoulder is improving. He acutally does better with active ROM vs passive.     Clinical Decision Making  Low    Rehab Potential  Good    PT Frequency  2x / week     PT Duration  4 weeks    PT Treatment/Interventions  ADLs/Self Care Home Management;Cryotherapy;Electrical Stimulation;Iontophoresis '4mg'$ /ml Dexamethasone;Moist Heat;Therapeutic activities;Ultrasound;Traction;Therapeutic exercise;Neuromuscular re-education;Patient/family education;Dry needling;Passive range of motion;Manual techniques;Taping;Vasopneumatic Device    PT Next Visit Plan  continue DN- upper trap, levator, scalene; lower trap activation; review technique with ther-ex     PT Home Exercise Plan  supine chin tuck, scap retraction & GHJ flx wand; upper trap & levator stretch, STM with tennis ball; first rib mob with sheet    Consulted and Agree with Plan of Care  Patient       Patient will benefit from skilled therapeutic intervention in order to improve the following deficits and impairments:  Decreased activity tolerance, Decreased strength, Impaired UE functional use, Pain, Increased muscle spasms, Improper body mechanics, Decreased range of motion, Impaired flexibility, Postural dysfunction  Visit Diagnosis: Acute pain of left shoulder  Muscle weakness (generalized)  Cervicalgia  PHYSICAL THERAPY DISCHARGE SUMMARY  Visits from Start of Care: 12  Current functional level related to goals / functional outcomes: Spoke with patient on telephone 2nd to clinic closure 2nd to covid 19. The patient is perfroming his exercises at home and feels he is having continued improvement in shoulder motion and function.    Remaining deficits: Unable to assess 2nd to inability to perform in person visit   Education / Equipment: HEP   Plan: Patient agrees to discharge.  Patient goals were partially met. Patient is being discharged due to being pleased with the current functional level.  ?????       Problem List There are no active problems to display for this patient.   Carney Living PT DPT  11/30/2018, 1:55 PM  [facility] [address] [place], [place], [address] Phone:  [phone]   Fax:  [phone]  Name: Mark Abbott MRN: [id number] Date of Birth: 09-14-1966

## 2018-12-05 ENCOUNTER — Ambulatory Visit: Payer: PRIVATE HEALTH INSURANCE | Admitting: Physical Therapy

## 2018-12-08 ENCOUNTER — Ambulatory Visit: Payer: PRIVATE HEALTH INSURANCE | Admitting: Physical Therapy

## 2018-12-10 ENCOUNTER — Other Ambulatory Visit: Payer: Self-pay

## 2018-12-10 ENCOUNTER — Ambulatory Visit (HOSPITAL_COMMUNITY)
Admission: EM | Admit: 2018-12-10 | Discharge: 2018-12-10 | Disposition: A | Discharge status: Home / Self Care | Payer: PRIVATE HEALTH INSURANCE | Attending: Emergency Medicine | Admitting: Emergency Medicine

## 2018-12-10 ENCOUNTER — Encounter (HOSPITAL_COMMUNITY): Payer: Self-pay | Admitting: *Deleted

## 2018-12-10 DIAGNOSIS — J4521 Mild intermittent asthma with (acute) exacerbation: Secondary | ICD-10-CM

## 2018-12-10 DIAGNOSIS — J069 Acute upper respiratory infection, unspecified: Secondary | ICD-10-CM | POA: Diagnosis not present

## 2018-12-10 DIAGNOSIS — B9789 Other viral agents as the cause of diseases classified elsewhere: Secondary | ICD-10-CM | POA: Diagnosis not present

## 2018-12-10 MED ORDER — IPRATROPIUM-ALBUTEROL 0.5-2.5 (3) MG/3ML IN SOLN: 3.0000 mL | Freq: Once | RESPIRATORY_TRACT | Status: DC

## 2018-12-10 MED ORDER — PREDNISONE 50 MG PO TABS: 50.0000 mg | ORAL_TABLET | Freq: Every day | ORAL | 0 refills | Status: AC

## 2018-12-10 MED ORDER — BENZONATATE 200 MG PO CAPS: 200.0000 mg | ORAL_CAPSULE | Freq: Three times a day (TID) | ORAL | 0 refills | Status: AC | PRN

## 2018-12-10 MED ORDER — CETIRIZINE HCL 10 MG PO CAPS: 10.0000 mg | ORAL_CAPSULE | Freq: Every day | ORAL | 0 refills | Status: DC

## 2018-12-10 MED ORDER — ALBUTEROL SULFATE HFA 108 (90 BASE) MCG/ACT IN AERS: 1.0000 | INHALATION_SPRAY | Freq: Four times a day (QID) | RESPIRATORY_TRACT | 0 refills | Status: DC | PRN

## 2018-12-10 NOTE — ED Provider Notes (Signed)
[facility]    CSN: [id number] Arrival date & time: 12/10/18  1632     History   Chief Complaint Chief Complaint  Patient presents with  . Cough    HPI Mark Abbott is a 53 y.o. male history of asthma presenting today for evaluation of cough and congestion.  Patient states that over the past 3 days he has had cough, congestion as well as noted wheezing and chest tightness in his chest.  He feels as if his asthma is flared up.  He says that he is still able to do most things without getting significantly short of breath.  He denies any fevers, but did note a low-grade fever of 99.4 prior to arrival.  He has been taking sinus pills, Mucinex, ibuprofen as well as a cough syrup without relief.Denies any recent travel, denies any known exposure to COVID-19.   HPI  Past Medical History:  Diagnosis Date  . Asthma     There are no active problems to display for this patient.   Past Surgical History:  Procedure Laterality Date  . APPENDECTOMY    . LYMPH NODE BIOPSY         Home Medications    Prior to Admission medications   Medication Sig Start Date End Date Taking? Authorizing Provider  ibuprofen (ADVIL,MOTRIN) 800 MG tablet Take 1 tablet (800 mg total) by mouth every 8 (eight) hours as needed. 10/23/18  Yes Hilts, Legrand Como, MD  traMADol (ULTRAM) 50 MG tablet TAKE 1 TABLET BY MOUTH TWICE DAILY AS NEEDED 11/15/18  Yes Hilts, Legrand Como, MD  albuterol (PROVENTIL HFA;VENTOLIN HFA) 108 (90 Base) MCG/ACT inhaler Inhale 1-2 puffs into the lungs every 6 (six) hours as needed for wheezing or shortness of breath. 12/10/18   Wieters, Hallie C, PA-C  baclofen (LIORESAL) 10 MG tablet Take 1 tablet (10 mg total) by mouth 3 (three) times daily as needed for muscle spasms. 10/20/18   Hilts, Legrand Como, MD  benzonatate (TESSALON) 200 MG capsule Take 1 capsule (200 mg total) by mouth 3 (three) times daily as needed for up to 7 days for cough. 12/10/18 12/17/18  Wieters, Hallie C, PA-C  Cetirizine  HCl 10 MG CAPS Take 1 capsule (10 mg total) by mouth daily for 10 days. 12/10/18 12/20/18  Wieters, Hallie C, PA-C  predniSONE (DELTASONE) 50 MG tablet Take 1 tablet (50 mg total) by mouth daily for 5 days. 12/10/18 12/15/18  Wieters, Elesa Hacker, PA-C    Family History Family History  Problem Relation Age of Onset  . Cancer Mother   . Cancer Father     Social History Social History   Tobacco Use  . Smoking status: Current Every Day Smoker    Packs/day: 0.25    Types: Cigarettes  . Smokeless tobacco: Never Used  Substance Use Topics  . Alcohol use: No  . Drug use: No     Allergies   Shellfish allergy   Review of Systems Review of Systems  Constitutional: Negative for activity change, appetite change, chills, fatigue and fever.  HENT: Positive for congestion, rhinorrhea and sinus pressure. Negative for ear pain, sore throat and trouble swallowing.   Eyes: Negative for discharge and redness.  Respiratory: Positive for cough, chest tightness and wheezing. Negative for shortness of breath.   Cardiovascular: Negative for chest pain.  Gastrointestinal: Negative for abdominal pain, diarrhea, nausea and vomiting.  Musculoskeletal: Negative for myalgias.  Skin: Negative for rash.  Neurological: Negative for dizziness, light-headedness and headaches.  Physical Exam Triage Vital Signs ED Triage Vitals  Enc Vitals Group     BP 12/10/18 1643 (!) 141/82     Pulse Rate 12/10/18 1643 80     Resp 12/10/18 1643 16     Temp 12/10/18 1643 98.7 F (37.1 C)     Temp Source 12/10/18 1643 Oral     SpO2 12/10/18 1643 100 %     Weight --      Height --      Head Circumference --      Peak Flow --      Pain Score 12/10/18 1645 0     Pain Loc --      Pain Edu? --      Excl. in [place]? --    No data found.  Updated Vital Signs BP (!) 141/82 (BP Location: Left Arm)   Pulse 80   Temp 98.7 F (37.1 C) (Oral)   Resp 16   SpO2 100%   Visual Acuity Right Eye Distance:   Left Eye  Distance:   Bilateral Distance:    Right Eye Near:   Left Eye Near:    Bilateral Near:     Physical Exam Vitals signs and nursing note reviewed.  Constitutional:      Appearance: He is well-developed.  HENT:     Head: Normocephalic and atraumatic.     Ears:     Comments: Bilateral TMs partially visualized due to cerumen impaction, non-erythematous in areas visualized    Nose:     Comments: Nasal mucosa pink, nonswollen turbinates    Mouth/Throat:     Comments: Oral mucosa pink and moist, no tonsillar enlargement or exudate. Posterior pharynx patent and nonerythematous, no uvula deviation or swelling. Normal phonation. Eyes:     Conjunctiva/sclera: Conjunctivae normal.  Neck:     Musculoskeletal: Neck supple.  Cardiovascular:     Rate and Rhythm: Normal rate and regular rhythm.     Heart sounds: No murmur.  Pulmonary:     Effort: Pulmonary effort is normal. No respiratory distress.     Breath sounds: Rhonchi present.     Comments: Mild wheezing in bilateral lung fields, rhonchi and coarse breath sounds present throughout Breathing comfortably at rest, no accessory muscle use Abdominal:     Palpations: Abdomen is soft.     Tenderness: There is no abdominal tenderness.  Skin:    General: Skin is warm and dry.  Neurological:     Mental Status: He is alert.      UC Treatments / Results  Labs (all labs ordered are listed, but only abnormal results are displayed) Labs Reviewed - No data to display  EKG None  Radiology No results found.  Procedures Procedures (including critical care time)  Medications Ordered in UC Medications - No data to display  Initial Impression / Assessment and Plan / UC Course  I have reviewed the triage vital signs and the nursing notes.  Pertinent labs & imaging results that were available during my care of the patient were reviewed by me and considered in my medical decision making (see chart for details).     Patient likely with  asthma exacerbation secondary to viral URI or allergies.  Patient has no known exposure to COVID-19.  Will treat symptomatically and supportively at this time.  Provided albuterol inhaler refill, course of prednisone.  Continue Mucinex, may add in cetirizine to help with congestion and drainage.  Provided precautions on return to work and staying home in  regards to COVID-19.  Continue to monitor breathing, shortness of breath, temperature,Discussed strict return precautions. Patient verbalized understanding and is agreeable with plan.  Final Clinical Impressions(s) / UC Diagnoses   Final diagnoses:  Viral URI with cough     Discharge Instructions     Please begin prednisone daily with food for the next 5 days Please use albuterol inhaler as needed for shortness of breath, wheezing, chest tightness every 4-6 hours May use Tessalon as needed for cough every 8 hours or over-the-counter Delsym, Mucinex DM May use daily cetirizine/Zyrtec or Claritin to help with congestion and drainage, I have sent in the generic of this, you may get this over-the-counter if it is cheaper May continue supplement with Mucinex Rest, drink plenty of fluids Please follow-up if developing worsening shortness of breath, cough, fevers, worsening symptoms    ED Prescriptions    Medication Sig Dispense Auth. Provider   predniSONE (DELTASONE) 50 MG tablet Take 1 tablet (50 mg total) by mouth daily for 5 days. 5 tablet Wieters, Hallie C, PA-C   albuterol (PROVENTIL HFA;VENTOLIN HFA) 108 (90 Base) MCG/ACT inhaler Inhale 1-2 puffs into the lungs every 6 (six) hours as needed for wheezing or shortness of breath. 1 Inhaler Wieters, Hallie C, PA-C   benzonatate (TESSALON) 200 MG capsule Take 1 capsule (200 mg total) by mouth 3 (three) times daily as needed for up to 7 days for cough. 28 capsule Wieters, Hallie C, PA-C   Cetirizine HCl 10 MG CAPS Take 1 capsule (10 mg total) by mouth daily for 10 days. 10 capsule Wieters, Hallie  C, PA-C     Controlled Substance Prescriptions [place] Controlled Substance Registry consulted? Not Applicable   Janith Lima, [place] 12/10/18 1729

## 2018-12-10 NOTE — Discharge Instructions (Addendum)
Please begin prednisone daily with food for the next 5 days Please use albuterol inhaler as needed for shortness of breath, wheezing, chest tightness every 4-6 hours May use Tessalon as needed for cough every 8 hours or over-the-counter Delsym, Mucinex DM May use daily cetirizine/Zyrtec or Claritin to help with congestion and drainage, I have sent in the generic of this, you may get this over-the-counter if it is cheaper May continue supplement with Mucinex Rest, drink plenty of fluids Please follow-up if developing worsening shortness of breath, cough, fevers, worsening symptoms

## 2018-12-10 NOTE — ED Triage Notes (Signed)
C/O general malaise x 1 wk; cough x few days.  Reports 99.4 temp at home.

## 2018-12-12 ENCOUNTER — Ambulatory Visit: Payer: PRIVATE HEALTH INSURANCE | Admitting: Physical Therapy

## 2018-12-14 ENCOUNTER — Ambulatory Visit: Payer: PRIVATE HEALTH INSURANCE | Admitting: Physical Therapy

## 2018-12-27 ENCOUNTER — Telehealth: Payer: Self-pay | Admitting: Physical Therapy

## 2018-12-27 NOTE — Telephone Encounter (Signed)
Spoke with patient regarding temporary clinic closure 2nd to Covid-19. The patient feels comfortable with exercises independently going forward. Please see addendum of last note for discharge to [facility].
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
## 2019-09-12 DIAGNOSIS — F1190 Opioid use, unspecified, uncomplicated: Secondary | ICD-10-CM | POA: Insufficient documentation

## 2019-09-12 DIAGNOSIS — J4520 Mild intermittent asthma, uncomplicated: Secondary | ICD-10-CM | POA: Insufficient documentation

## 2019-10-13 ENCOUNTER — Other Ambulatory Visit: Payer: Self-pay

## 2019-10-13 ENCOUNTER — Emergency Department (HOSPITAL_COMMUNITY)
Admission: EM | Admit: 2019-10-13 | Discharge: 2019-10-13 | Disposition: A | Discharge status: Home / Self Care | Payer: PRIVATE HEALTH INSURANCE | Attending: Emergency Medicine | Admitting: Emergency Medicine

## 2019-10-13 ENCOUNTER — Encounter (HOSPITAL_COMMUNITY): Payer: Self-pay | Admitting: Emergency Medicine

## 2019-10-13 DIAGNOSIS — F17210 Nicotine dependence, cigarettes, uncomplicated: Secondary | ICD-10-CM | POA: Diagnosis not present

## 2019-10-13 DIAGNOSIS — F1120 Opioid dependence, uncomplicated: Secondary | ICD-10-CM | POA: Diagnosis present

## 2019-10-13 DIAGNOSIS — F1190 Opioid use, unspecified, uncomplicated: Secondary | ICD-10-CM

## 2019-10-13 MED ORDER — METHADONE HCL 5 MG PO TABS
65.0000 mg | ORAL_TABLET | Freq: Once | ORAL | Status: AC
  Administered 2019-10-13: 65 mg via ORAL
  Filled 2019-10-13: qty 6

## 2019-10-13 MED ORDER — METHADONE HCL 10 MG/ML PO CONC: 65.0000 mg | Freq: Once | ORAL | Status: DC

## 2019-10-13 NOTE — ED Triage Notes (Signed)
Pt was late to methadone clinic this morning and they wouldn't let him come in.  States he can't go back until Monday.  Requesting methadone because he states he doesn't want to use drugs.

## 2019-10-13 NOTE — Discharge Instructions (Addendum)
You can return tomorrow for your next dose of methadone, however your future doses will need to be obtained through your methadone clinic.

## 2019-10-13 NOTE — ED Provider Notes (Signed)
[facility] EMERGENCY DEPARTMENT Provider Note   CSN: [id number] Arrival date & time: 10/13/19  0827     History Chief Complaint  Patient presents with  . missed methadone appt    Mark Abbott is a 54 y.o. male with past medical history of heroin dependence, presenting to the emergency department requesting morning dose of methadone.  He states he has been clean for a year and doing well on his methadone treatments.  He states yesterday he was unable to get his take-home doses yesterday for the weekend because his paycheck was not coming in until the afternoon.  He states for this reason he went to the clinic this morning to pick up his take home doses, however the hours were different and he arrived 10 minutes after the window closed.  He states he has never missed an appointment and is concerned for relapsing.  Patient presents with documentation of his methadone treatments throughout this month. He is prescribed 65mg  oral solution once daily. His last dose was yesterday morning. Image below. Per chart review, patient has not presented to the ED with similar request in the past.   The history is provided by the patient and medical records.       Past Medical History:  Diagnosis Date  . Asthma     There are no problems to display for this patient.   Past Surgical History:  Procedure Laterality Date  . APPENDECTOMY    . LYMPH NODE BIOPSY         Family History  Problem Relation Age of Onset  . Cancer Mother   . Cancer Father     Social History   Tobacco Use  . Smoking status: Current Every Day Smoker    Packs/day: 0.25    Types: Cigarettes  . Smokeless tobacco: Never Used  Substance Use Topics  . Alcohol use: No  . Drug use: No    Home Medications Prior to Admission medications   Medication Sig Start Date End Date Taking? Authorizing Provider  albuterol (PROVENTIL HFA;VENTOLIN HFA) 108 (90 Base) MCG/ACT inhaler Inhale 1-2 puffs into the  lungs every 6 (six) hours as needed for wheezing or shortness of breath. 12/10/18   Wieters, Hallie C, PA-C  baclofen (LIORESAL) 10 MG tablet Take 1 tablet (10 mg total) by mouth 3 (three) times daily as needed for muscle spasms. 10/20/18   Hilts, Legrand Como, MD  Cetirizine HCl 10 MG CAPS Take 1 capsule (10 mg total) by mouth daily for 10 days. 12/10/18 12/20/18  Wieters, Hallie C, PA-C  ibuprofen (ADVIL,MOTRIN) 800 MG tablet Take 1 tablet (800 mg total) by mouth every 8 (eight) hours as needed. 10/23/18   Hilts, Legrand Como, MD  traMADol (ULTRAM) 50 MG tablet TAKE 1 TABLET BY MOUTH TWICE DAILY AS NEEDED 11/15/18   Hilts, Legrand Como, MD    Allergies    Shellfish allergy  Review of Systems   Review of Systems  All other systems reviewed and are negative.   Physical Exam Updated Vital Signs BP (!) 128/97 (BP Location: Right Arm)   Pulse 89   Temp 97.7 F (36.5 C) (Oral)   Resp 18   SpO2 100%   Physical Exam Vitals and nursing note reviewed.  Constitutional:      General: He is not in acute distress.    Appearance: He is well-developed.  HENT:     Head: Normocephalic and atraumatic.  Eyes:     Conjunctiva/sclera: Conjunctivae normal.  Cardiovascular:  Rate and Rhythm: Normal rate.  Pulmonary:     Effort: Pulmonary effort is normal.  Abdominal:     Palpations: Abdomen is soft.  Skin:    General: Skin is warm.  Neurological:     Mental Status: He is alert.  Psychiatric:        Mood and Affect: Mood normal.        Behavior: Behavior normal.       ED Results / Procedures / Treatments   Labs (all labs ordered are listed, but only abnormal results are displayed) Labs Reviewed - No data to display  EKG None  Radiology No results found.  Procedures Procedures (including critical care time)  Medications Ordered in ED Medications  methadone (DOLOPHINE) tablet 65 mg (has no administration in time range)    ED Course  I have reviewed the triage vital signs and the nursing  notes.  Pertinent labs & imaging results that were available during my care of the patient were reviewed by me and considered in my medical decision making (see chart for details).    MDM Rules/Calculators/A&P                      Patient with history of heroin dependence, presenting after arriving to late to his methadone clinic this morning to pick up his weekend doses.  He states he has never missed an appointment and is concerned for relapsing without his daily methadone dose.  Per chart review, he has not presented to the ED or surrounding area with similar complaint.  He states he is doing well over the last year on methadone and has not had any relapses.  He also presents with a record of his methadone treatments and dosage.  He is dosed at 65 mg daily.  His last dose was yesterday and it does not appear they sent him home with his "take home" doses as patient reported.  Patient discussed with Dr. Vanita Panda.  Will administer his dose of methadone here in the ED.  He is concerned for treatment tomorrow as his clinic is closed on Sunday. Patient can return tomorrow for additional dose. He is made aware he cannot continue to present to the ED for regular methadone doses. He will need to report to his methadone clinic for all future treatments Monday. Pt verbalized understanding and is agreeable with plan. Pt discharged.  Final Clinical Impression(s) / ED Diagnoses Final diagnoses:  Methadone use [facility])    Rx / [place] Orders ED Discharge Orders    None       Fianna Snowball, [place] N, PA-C 10/13/19 [id number]    Carmin Muskrat, MD 10/14/19 [phone]

## 2019-10-14 ENCOUNTER — Other Ambulatory Visit: Payer: Self-pay

## 2019-10-14 ENCOUNTER — Emergency Department (HOSPITAL_COMMUNITY)
Admission: EM | Admit: 2019-10-14 | Discharge: 2019-10-14 | Disposition: A | Discharge status: Home / Self Care | Payer: PRIVATE HEALTH INSURANCE | Attending: Emergency Medicine | Admitting: Emergency Medicine

## 2019-10-14 ENCOUNTER — Encounter (HOSPITAL_COMMUNITY): Payer: Self-pay | Admitting: Emergency Medicine

## 2019-10-14 DIAGNOSIS — J45909 Unspecified asthma, uncomplicated: Secondary | ICD-10-CM | POA: Insufficient documentation

## 2019-10-14 DIAGNOSIS — F1120 Opioid dependence, uncomplicated: Secondary | ICD-10-CM | POA: Diagnosis present

## 2019-10-14 DIAGNOSIS — F17210 Nicotine dependence, cigarettes, uncomplicated: Secondary | ICD-10-CM | POA: Diagnosis not present

## 2019-10-14 DIAGNOSIS — F1190 Opioid use, unspecified, uncomplicated: Secondary | ICD-10-CM

## 2019-10-14 MED ORDER — METHADONE HCL 5 MG PO TABS
65.0000 mg | ORAL_TABLET | Freq: Once | ORAL | Status: AC
  Administered 2019-10-14: 65 mg via ORAL
  Filled 2019-10-14: qty 1

## 2019-10-14 NOTE — ED Provider Notes (Signed)
[name] EMERGENCY DEPARTMENT Provider Note   CSN: [id number] Arrival date & time: 10/14/19  0756     History Chief Complaint  Patient presents with  . Requests Methadone    Mark Abbott is a 54 y.o. male with a past medical history significant for asthma and heroin dependence who presents to the ED requesting dose of methadone. Chart reviewed. Patient was seen in the ED yesterday for his morning dose where he provided documentation of his methadone treatments throughout this month. Patient was told to return to the ED today to receive his Sunday dose. Patient notes he was unable to get his weekend doses because there was an issue with his paycheck and it didn't post in his account until the afternoon. He arrived at the methadone clinic 10 minutes too late on Saturday and was unable to get his weekend doses. Patient has no complaints except for mild gas which he believes is attributed to different form of methadone he received yesterday, but denies any signs of withdrawal. Per chart review, patient has not been to the ED in the past for similar request except for yesterday.       Past Medical History:  Diagnosis Date  . Asthma     There are no problems to display for this patient.   Past Surgical History:  Procedure Laterality Date  . APPENDECTOMY    . LYMPH NODE BIOPSY         Family History  Problem Relation Age of Onset  . Cancer Mother   . Cancer Father     Social History   Tobacco Use  . Smoking status: Current Every Day Smoker    Packs/day: 0.25    Types: Cigarettes  . Smokeless tobacco: Never Used  Substance Use Topics  . Alcohol use: No  . Drug use: No    Home Medications Prior to Admission medications   Medication Sig Start Date End Date Taking? Authorizing Provider  albuterol (PROVENTIL HFA;VENTOLIN HFA) 108 (90 Base) MCG/ACT inhaler Inhale 1-2 puffs into the lungs every 6 (six) hours as needed for wheezing or shortness of breath.  12/10/18   Wieters, Hallie C, PA-C  baclofen (LIORESAL) 10 MG tablet Take 1 tablet (10 mg total) by mouth 3 (three) times daily as needed for muscle spasms. 10/20/18   Hilts, Legrand Como, MD  Cetirizine HCl 10 MG CAPS Take 1 capsule (10 mg total) by mouth daily for 10 days. 12/10/18 12/20/18  Wieters, Hallie C, PA-C  ibuprofen (ADVIL,MOTRIN) 800 MG tablet Take 1 tablet (800 mg total) by mouth every 8 (eight) hours as needed. 10/23/18   Hilts, Legrand Como, MD  traMADol (ULTRAM) 50 MG tablet TAKE 1 TABLET BY MOUTH TWICE DAILY AS NEEDED 11/15/18   Hilts, Legrand Como, MD    Allergies    Shellfish allergy  Review of Systems   Review of Systems  Constitutional: Negative for chills, diaphoresis and fever.  HENT: Negative for rhinorrhea.   Respiratory: Negative for shortness of breath.   Cardiovascular: Negative for chest pain.  Gastrointestinal: Negative for abdominal distention, abdominal pain, constipation, diarrhea and vomiting.  All other systems reviewed and are negative.   Physical Exam Updated Vital Signs BP 118/70 (BP Location: Right Arm)   Pulse 65   Temp 98.1 F (36.7 C) (Oral)   Resp 16   SpO2 100%   Physical Exam Vitals and nursing note reviewed.  Constitutional:      General: He is not in acute distress.    Appearance: He  is not ill-appearing.  HENT:     Head: Normocephalic.     Nose: No rhinorrhea.  Eyes:     Pupils: Pupils are equal, round, and reactive to light.  Cardiovascular:     Rate and Rhythm: Normal rate and regular rhythm.     Pulses: Normal pulses.     Heart sounds: Normal heart sounds. No murmur. No friction rub. No gallop.   Pulmonary:     Effort: Pulmonary effort is normal.     Breath sounds: Normal breath sounds.  Abdominal:     General: Abdomen is flat. There is no distension.     Palpations: Abdomen is soft.     Tenderness: There is no abdominal tenderness. There is no guarding or rebound.  Musculoskeletal:     Cervical back: Neck supple.     Comments: Able to  move all 4 extremities without difficulty.   Skin:    General: Skin is warm and dry.  Neurological:     General: No focal deficit present.     Mental Status: He is alert.  Psychiatric:        Mood and Affect: Mood normal.        Behavior: Behavior normal.       ED Results / Procedures / Treatments   Labs (all labs ordered are listed, but only abnormal results are displayed) Labs Reviewed - No data to display  EKG None  Radiology No results found.  Procedures Procedures (including critical care time)  Medications Ordered in ED Medications  methadone (DOLOPHINE) tablet 65 mg (has no administration in time range)    ED Course  I have reviewed the triage vital signs and the nursing notes.  Pertinent labs & imaging results that were available during my care of the patient were reviewed by me and considered in my medical decision making (see chart for details).    MDM Rules/Calculators/A&P                       Final Clinical Impression(s) / ED Diagnoses Final diagnoses:  Methadone use ([place])   54 year old male with a history of heroin dependence presents to the ED requesting dose of methadone. He was seen in the ED yesterday and was told to come back today for his Sunday dose. Patient notes he has never missed an appointment before and per chart review, he has not been seen in the ED for similar request except for yesterday. His record for methadone treatments and dosage is shown above. He is dosed 65mg  daily. His last dose was yesterday in the ED. Patient has no signs of withdrawal on physical exam. Will give patient 65mg  methadone dose. Discussed case with Dr. Gilford Raid who agrees with assessment and plan. I discussed with patient at length that he cannot continue to come to the ED for regular methadone doses which he is agreeable to. He promises to return to the methadone clinic tomorrow for further doses. Strict ED precautions discussed with patient. Patient states  understanding and agrees to plan. Patient discharged home in no acute distress and stable vitals  Rx / DC Orders ED Discharge Orders    None       Karie Kirks 10/14/19 0825    Isla Pence, MD 10/14/19 [phone]

## 2019-10-14 NOTE — Discharge Instructions (Signed)
As discussed, make sure you go to the methadone clinic tomorrow for your normal dose.

## 2019-10-14 NOTE — ED Triage Notes (Signed)
Pt returns for dose of methadone.  Arrived to methadone clinic late yesterday and can't get back in until tomorrow.  Came to ED for dose yesterday and told to return for today's dose.  Denies complaints.

## 2020-07-29 ENCOUNTER — Telehealth: Payer: Self-pay | Admitting: Family Medicine

## 2020-07-29 NOTE — Telephone Encounter (Signed)
-----   Message from Marlyne Beards, [place] sent at 07/29/2020  8:33 AM EST ----- Morning! This patient was scheduled for this Friday at 10:00 to establish primary care with Dr. Junius Roads. These are only to be scheduled for 8:20 or 1:00. Would you please call him to reschedule this?

## 2020-07-29 NOTE — Telephone Encounter (Signed)
Thank you :)

## 2020-07-29 NOTE — Telephone Encounter (Signed)
Called pt and RS for the 18th at 8:20

## 2020-08-01 ENCOUNTER — Ambulatory Visit: Payer: PRIVATE HEALTH INSURANCE | Admitting: Family Medicine

## 2020-08-07 ENCOUNTER — Ambulatory Visit: Payer: PRIVATE HEALTH INSURANCE | Admitting: Family Medicine

## 2020-09-06 ENCOUNTER — Emergency Department (HOSPITAL_COMMUNITY)
Admission: EM | Admit: 2020-09-06 | Discharge: 2020-09-06 | Disposition: A | Discharge status: Home / Self Care | Payer: PRIVATE HEALTH INSURANCE | Attending: Emergency Medicine | Admitting: Emergency Medicine

## 2020-09-06 ENCOUNTER — Encounter (HOSPITAL_COMMUNITY): Payer: Self-pay

## 2020-09-06 ENCOUNTER — Other Ambulatory Visit: Payer: Self-pay

## 2020-09-06 DIAGNOSIS — J45909 Unspecified asthma, uncomplicated: Secondary | ICD-10-CM | POA: Insufficient documentation

## 2020-09-06 DIAGNOSIS — F17210 Nicotine dependence, cigarettes, uncomplicated: Secondary | ICD-10-CM | POA: Diagnosis not present

## 2020-09-06 DIAGNOSIS — F1120 Opioid dependence, uncomplicated: Secondary | ICD-10-CM

## 2020-09-06 DIAGNOSIS — R110 Nausea: Secondary | ICD-10-CM | POA: Insufficient documentation

## 2020-09-06 MED ORDER — METHADONE HCL 10 MG PO TABS
65.0000 mg | ORAL_TABLET | Freq: Every day | ORAL | Status: DC
  Administered 2020-09-06: 18:00:00 65 mg via ORAL
  Filled 2020-09-06: qty 7

## 2020-09-06 NOTE — ED Provider Notes (Signed)
[facility] Provider Note   CSN: [id number] Arrival date & time: 09/06/20  1044     History Chief Complaint  Patient presents with  . missed methadone dose    Mark Abbott is a 54 y.o. male presenting to the emergency department with missed methadone dose.  The patient reports that he accidentally overslept through his methadone clinic today.  He is starting to feel some withdrawal symptoms of nausea in the stomach.  He reports that his dose of 65 mg daily.  Medical record review does show this dosing confirmed in Jan 2021 on prior ED visit for similar complaints.    He has no other acute medical complaints.  HPI     Past Medical History:  Diagnosis Date  . Asthma     There are no problems to display for this patient.   Past Surgical History:  Procedure Laterality Date  . APPENDECTOMY    . LYMPH NODE BIOPSY         Family History  Problem Relation Age of Onset  . Cancer Mother   . Cancer Father     Social History   Tobacco Use  . Smoking status: Current Every Day Smoker    Packs/day: 0.25    Types: Cigarettes  . Smokeless tobacco: Never Used  Vaping Use  . Vaping Use: Never used  Substance Use Topics  . Alcohol use: No  . Drug use: No    Home Medications Prior to Admission medications   Medication Sig Start Date End Date Taking? Authorizing Provider  albuterol (PROVENTIL HFA;VENTOLIN HFA) 108 (90 Base) MCG/ACT inhaler Inhale 1-2 puffs into the lungs every 6 (six) hours as needed for wheezing or shortness of breath. 12/10/18   Wieters, Mark C, PA-[facility]  baclofen (LIORESAL) 10 MG tablet Take 1 tablet (10 mg total) by mouth 3 (three) times daily as needed for muscle spasms. 10/20/18   Hilts, Mark Como, MD  Cetirizine HCl 10 MG CAPS Take 1 capsule (10 mg total) by mouth daily for 10 days. 12/10/18 12/20/18  Wieters, Mark C, PA-Abbott  ibuprofen (ADVIL,MOTRIN) 800 MG tablet Take 1 tablet (800 mg total) by mouth every 8 (eight)  hours as needed. 10/23/18   Hilts, Mark Como, MD  traMADol (ULTRAM) 50 MG tablet TAKE 1 TABLET BY MOUTH TWICE DAILY AS NEEDED 11/15/18   Hilts, Mark Como, MD    Allergies    Shellfish allergy  Review of Systems   Review of Systems  Constitutional: Negative for chills and fever.  HENT: Negative for ear pain and sore throat.   Eyes: Negative for pain and visual disturbance.  Respiratory: Negative for cough and shortness of breath.   Cardiovascular: Negative for chest pain and palpitations.  Gastrointestinal: Negative for abdominal pain and vomiting.  Genitourinary: Negative for dysuria and hematuria.  Musculoskeletal: Negative for arthralgias and myalgias.  Skin: Negative for rash and wound.  Neurological: Negative for syncope and headaches.  Psychiatric/Behavioral: Negative for agitation and confusion.  All other systems reviewed and are negative.   Physical Exam Updated Vital Signs BP 115/84 (BP Location: Right Arm)   Pulse 74   Temp 98.4 F (36.9 Abbott) (Oral)   Resp 18   SpO2 98%   Physical Exam Vitals and nursing note reviewed.  Constitutional:      Appearance: He is well-developed and well-nourished.  HENT:     Head: Normocephalic and atraumatic.  Eyes:     Conjunctiva/sclera: Conjunctivae normal.  Cardiovascular:     Rate and  Rhythm: Normal rate and regular rhythm.  Pulmonary:     Effort: Pulmonary effort is normal. No respiratory distress.  Musculoskeletal:        General: No edema.     Cervical back: Neck supple.  Skin:    General: Skin is warm and dry.  Neurological:     Mental Status: He is alert.  Psychiatric:        Mood and Affect: Mood and affect and mood normal.        Behavior: Behavior normal.     ED Results / Procedures / Treatments   Labs (all labs ordered are listed, but only abnormal results are displayed) Labs Reviewed - No data to display  EKG None  Radiology No results found.  Procedures Procedures (including critical care  time)  Medications Ordered in ED Medications - No data to display  ED Course  I have reviewed the triage vital signs and the nursing notes.  Pertinent labs & imaging results that were available during my care of the patient were reviewed by me and considered in my medical decision making (see chart for details).  54 yo male here for methadone dose Missed clinic today  Otherwise no acute medical complaints, stable Methadone given, dose verified from prior ED visit records, 65 mg Advised to f/u in clinic tomorrow   Final Clinical Impression(s) / ED Diagnoses Final diagnoses:  Methadone maintenance therapy patient [facility]    Rx / DC Orders ED Discharge Orders    None       [facility], Mark Rhine, MD 09/07/20 [phone]

## 2020-09-06 NOTE — ED Triage Notes (Signed)
Patient reports that he missed his methadone clinic appointment and here requesting dose so he can go to work

## 2020-09-06 NOTE — Discharge Instructions (Addendum)
We gave you a dose of your methadone today on 09/06/20.  You will need to go back to your clinic tomorrow for your next dose.

## 2021-05-26 ENCOUNTER — Ambulatory Visit (INDEPENDENT_AMBULATORY_CARE_PROVIDER_SITE_OTHER): Payer: BC Managed Care – PPO | Admitting: Podiatry

## 2021-05-26 ENCOUNTER — Ambulatory Visit: Payer: BC Managed Care – PPO

## 2021-05-26 ENCOUNTER — Encounter: Payer: Self-pay | Admitting: Podiatry

## 2021-05-26 ENCOUNTER — Other Ambulatory Visit: Payer: Self-pay

## 2021-05-26 DIAGNOSIS — M7752 Other enthesopathy of left foot: Secondary | ICD-10-CM | POA: Diagnosis not present

## 2021-05-26 DIAGNOSIS — D2372 Other benign neoplasm of skin of left lower limb, including hip: Secondary | ICD-10-CM | POA: Diagnosis not present

## 2021-05-26 DIAGNOSIS — M778 Other enthesopathies, not elsewhere classified: Secondary | ICD-10-CM

## 2021-05-26 MED ORDER — DEXAMETHASONE SODIUM PHOSPHATE 120 MG/30ML IJ SOLN
2.0000 mg | Freq: Once | INTRAMUSCULAR | Status: AC
  Administered 2021-05-26: 2 mg via INTRA_ARTICULAR

## 2021-05-26 NOTE — Progress Notes (Signed)
  Subjective:  Patient ID: Mark [facility], male    DOB: 1966-08-17,  MRN: [id number] HPI Chief Complaint  Patient presents with   Toe Pain    Hallux left - skin being pinched plantarly by the 2nd toe, gets callused and sore, ongoing x years, worse at the end of the day after shoe wear and work   New Patient (Initial Visit)    55 y.o. male presents with the above complaint.   ROS: He denies fever chills nausea vomiting muscle aches pains calf pain back pain chest pain shortness of breath.  Past Medical History:  Diagnosis Date   Asthma    Past Surgical History:  Procedure Laterality Date   APPENDECTOMY     LYMPH NODE BIOPSY      Current Outpatient Medications:    cetirizine (ZYRTEC) 10 MG tablet, Take 10 mg by mouth at bedtime., Disp: , Rfl:   Allergies  Allergen Reactions   Peanut-Containing Drug Products Anaphylaxis   Shellfish Allergy Anaphylaxis   Review of Systems Objective:  There were no vitals filed for this visit.  General: Well developed, nourished, in no acute distress, alert and oriented x3   Dermatological: Skin is warm, dry and supple bilateral. Nails x 10 are well maintained; remaining integument appears unremarkable at this time. There are no open sores, no preulcerative lesions, no rash or signs of infection present.  Vascular: Dorsalis Pedis artery and Posterior Tibial artery pedal pulses are 2/4 bilateral with immedate capillary fill time. Pedal hair growth present. No varicosities and no lower extremity edema present bilateral.   Neruologic: Grossly intact via light touch bilateral. Vibratory intact via tuning fork bilateral. Protective threshold with Semmes Wienstein monofilament intact to all pedal sites bilateral. Patellar and Achilles deep tendon reflexes 2+ bilateral. No Babinski or clonus noted bilateral.   Musculoskeletal: No gross boney pedal deformities bilateral. No pain, crepitus, or limitation noted with foot and ankle range of motion bilateral.  Muscular strength 5/5 in all groups tested bilateral.  Hallux abductovalgus deformity bilaterally.  Left is resulting in a juxtaposition to the second toe with a benign skin lesion to the lateral aspect of the hallux at the level of the interphalangeal joint.  Gait: Unassisted, Nonantalgic.    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  I also debrided the reactive hyperkeratotic tissue to the lateral aspect of his left great toe and placed padding.  He will follow-up with me in a month to 6 weeks at which time x-rays will be taken to evaluate for surgical consideration.     Aminat Shelburne T. [place], [place]

## 2022-04-23 ENCOUNTER — Encounter (HOSPITAL_COMMUNITY): Payer: Self-pay

## 2022-04-23 ENCOUNTER — Ambulatory Visit (HOSPITAL_COMMUNITY)
Admission: EM | Admit: 2022-04-23 | Discharge: 2022-04-23 | Disposition: A | Discharge status: Home / Self Care | Payer: BC Managed Care – PPO | Attending: Urgent Care | Admitting: Urgent Care

## 2022-04-23 DIAGNOSIS — H6123 Impacted cerumen, bilateral: Secondary | ICD-10-CM | POA: Diagnosis not present

## 2022-04-23 NOTE — ED Notes (Signed)
Left ear feels clogged. Muffled sounds Started last night.   Pain 2/10

## 2022-04-23 NOTE — ED Provider Notes (Signed)
[facility]    CSN: [id number] Arrival date & time: 04/23/22  1854      History   Chief Complaint Chief Complaint  Patient presents with   Ear Fullness    left    HPI Mark [facility] is a 56 y.o. male.   Pleasant 56 year old male presents today with concern of a left sided cerumen impaction.  States he was having some clogged sensation this morning, used a Q-tip and worsened.  Is requesting to have his wax removed.  Denies any additional URI symptoms.   Ear Fullness    Past Medical History:  Diagnosis Date   Asthma     Patient Active Problem List   Diagnosis Date Noted   Methadone use 09/12/2019   Mild intermittent asthma without complication 99/24/2683    Past Surgical History:  Procedure Laterality Date   APPENDECTOMY     LYMPH NODE BIOPSY         Home Medications    Prior to Admission medications   Medication Sig Start Date End Date Taking? Authorizing Provider  cetirizine (ZYRTEC) 10 MG tablet Take 10 mg by mouth at bedtime. 01/24/21   [provider]    Family History Family History  Problem Relation Age of Onset   Cancer Mother    Cancer Father     Social History Social History   Tobacco Use   Smoking status: Former    Packs/day: 0.25    Types: Cigarettes    Quit date: 2020    Years since quitting: 3.5   Smokeless tobacco: Never  Vaping Use   Vaping Use: Never used  Substance Use Topics   Alcohol use: No   Drug use: No     Allergies   Peanut-containing drug products and Shellfish allergy   Review of Systems Review of Systems  All other systems reviewed and are negative.    Physical Exam Triage Vital Signs ED Triage Vitals  Enc Vitals Group     BP 04/23/22 1954 (!) 126/91     Pulse Rate 04/23/22 1954 72     Resp --      Temp 04/23/22 1954 98.1 F (36.7 C)     Temp Source 04/23/22 1954 Oral     SpO2 04/23/22 1954 100 %     Weight --      Height --      Head Circumference --      Peak Flow --       Pain Score 04/23/22 2010 2     Pain Loc --      Pain Edu? --      Excl. in [place]? --    No data found.  Updated Vital Signs BP (!) 126/91 (BP Location: Right Arm)   Pulse 72   Temp 98.1 F (36.7 C) (Oral)   SpO2 100%   Visual Acuity Right Eye Distance:   Left Eye Distance:   Bilateral Distance:    Right Eye Near:   Left Eye Near:    Bilateral Near:     Physical Exam Vitals and nursing note reviewed.  Constitutional:      General: He is not in acute distress.    Appearance: Normal appearance. He is well-developed and normal weight. He is not ill-appearing, toxic-appearing or diaphoretic.  HENT:     Head: Normocephalic and atraumatic.     Right Ear: Tympanic membrane, ear canal and external ear normal. There is impacted cerumen.     Left Ear: Tympanic membrane,  ear canal and external ear normal. There is impacted cerumen.  Eyes:     Conjunctiva/sclera: Conjunctivae normal.  Cardiovascular:     Rate and Rhythm: Normal rate and regular rhythm.  Pulmonary:     Effort: Pulmonary effort is normal. No respiratory distress.  Musculoskeletal:     Cervical back: Neck supple.  Skin:    General: Skin is warm and dry.     Capillary Refill: Capillary refill takes less than 2 seconds.     Findings: No rash.  Neurological:     Mental Status: He is alert.  Psychiatric:        Mood and Affect: Mood normal.      UC Treatments / Results  Labs (all labs ordered are listed, but only abnormal results are displayed) Labs Reviewed - No data to display  EKG   Radiology No results found.  Procedures Ear Cerumen Removal  Date/Time: 04/23/2022 8:56 PM  Performed by: Chaney Malling, PA Authorized by: Chaney Malling, PA   Consent:    Consent obtained:  Verbal   Consent given by:  Patient   Risks, benefits, and alternatives were discussed: yes     Risks discussed:  Bleeding, dizziness, incomplete removal and infection Universal protocol:    Patient identity confirmed:   Verbally with patient Procedure details:    Location:  L ear and R ear   Procedure type: curette     Procedure type comment:  Bilateral irrigation, R sided curette   Procedure outcomes: cerumen removed   Post-procedure details:    Inspection:  No bleeding, ear canal clear and TM intact   Hearing quality:  Improved   Procedure completion:  Tolerated  (including critical care time)  Medications Ordered in UC Medications - No data to display  Initial Impression / Assessment and Plan / UC Course  I have reviewed the triage vital signs and the nursing notes.  Pertinent labs & imaging results that were available during my care of the patient were reviewed by me and considered in my medical decision making (see chart for details).     Bilateral cerumen impaction - removed in office with combined method of lavage and curette. No complications, tolerated well with complete wax removal.  Final Clinical Impressions(s) / UC Diagnoses   Final diagnoses:  Bilateral impacted cerumen     Discharge Instructions      Your ear wax was removed in office today. There is no sign of infection. If this continues, you can purchase the [employer].     ED Prescriptions   None    PDMP not reviewed this encounter.   Chaney Malling, [place] 04/23/22 [phone]

## 2022-04-23 NOTE — Discharge Instructions (Addendum)
Your ear wax was removed in office today. There is no sign of infection. If this continues, you can purchase the [employer].

## 2022-05-04 ENCOUNTER — Ambulatory Visit (HOSPITAL_COMMUNITY)
Admission: EM | Admit: 2022-05-04 | Discharge: 2022-05-04 | Discharge status: Absent without leave | Payer: BC Managed Care – PPO | Attending: Family Medicine | Admitting: Family Medicine
